# Patient Record
Sex: Female | Born: 1940 | Race: White | Hispanic: No | Marital: Single | Smoking: Former smoker
Health system: Southern US, Community
[De-identification: ages and names within clinical notes are randomized; demographics above are authoritative.]

## PROBLEM LIST (undated history)

## (undated) DIAGNOSIS — R945 Abnormal results of liver function studies: Secondary | ICD-10-CM

## (undated) DIAGNOSIS — M797 Fibromyalgia: Secondary | ICD-10-CM

## (undated) DIAGNOSIS — M8580 Other specified disorders of bone density and structure, unspecified site: Secondary | ICD-10-CM

## (undated) DIAGNOSIS — F32A Depression, unspecified: Secondary | ICD-10-CM

## (undated) DIAGNOSIS — F329 Major depressive disorder, single episode, unspecified: Secondary | ICD-10-CM

## (undated) DIAGNOSIS — Z820 Family history of epilepsy and other diseases of the nervous system: Secondary | ICD-10-CM

## (undated) DIAGNOSIS — M1990 Unspecified osteoarthritis, unspecified site: Secondary | ICD-10-CM

## (undated) DIAGNOSIS — E039 Hypothyroidism, unspecified: Secondary | ICD-10-CM

## (undated) DIAGNOSIS — K900 Celiac disease: Secondary | ICD-10-CM

## (undated) DIAGNOSIS — R7989 Other specified abnormal findings of blood chemistry: Secondary | ICD-10-CM

## (undated) HISTORY — PX: NASAL SINUS SURGERY: SHX719

## (undated) HISTORY — DX: Celiac disease: K90.0

## (undated) HISTORY — PX: ABDOMINAL HYSTERECTOMY: SHX81

## (undated) HISTORY — DX: Depression, unspecified: F32.A

## (undated) HISTORY — DX: Other specified disorders of bone density and structure, unspecified site: M85.80

## (undated) HISTORY — DX: Unspecified osteoarthritis, unspecified site: M19.90

## (undated) HISTORY — DX: Fibromyalgia: M79.7

## (undated) HISTORY — DX: Major depressive disorder, single episode, unspecified: F32.9

## (undated) HISTORY — PX: COSMETIC SURGERY: SHX468

## (undated) HISTORY — PX: OTHER SURGICAL HISTORY: SHX169

## (undated) HISTORY — PX: KNEE ARTHROSCOPY: SUR90

## (undated) HISTORY — DX: Abnormal results of liver function studies: R94.5

## (undated) HISTORY — DX: Hypothyroidism, unspecified: E03.9

## (undated) HISTORY — PX: BREAST SURGERY: SHX581

## (undated) HISTORY — PX: EXCISION MORTON'S NEUROMA: SHX5013

## (undated) HISTORY — DX: Other specified abnormal findings of blood chemistry: R79.89

## (undated) HISTORY — DX: Family history of epilepsy and other diseases of the nervous system: Z82.0

## (undated) HISTORY — PX: BLADDER SURGERY: SHX569

## (undated) HISTORY — PX: AUGMENTATION MAMMAPLASTY: SUR837

---

## 2000-04-21 ENCOUNTER — Other Ambulatory Visit: Admission: RE | Admit: 2000-04-21 | Discharge: 2000-04-21 | Payer: Self-pay | Admitting: Internal Medicine

## 2000-04-21 LAB — CONVERTED CEMR LAB

## 2000-09-06 ENCOUNTER — Encounter: Admission: RE | Admit: 2000-09-06 | Discharge: 2000-09-06 | Payer: Self-pay | Admitting: Internal Medicine

## 2000-09-06 ENCOUNTER — Encounter: Payer: Self-pay | Admitting: Internal Medicine

## 2001-05-13 ENCOUNTER — Ambulatory Visit (HOSPITAL_BASED_OUTPATIENT_CLINIC_OR_DEPARTMENT_OTHER): Admission: RE | Admit: 2001-05-13 | Discharge: 2001-05-13 | Payer: Self-pay | Admitting: Internal Medicine

## 2001-10-09 ENCOUNTER — Encounter: Payer: Self-pay | Admitting: Emergency Medicine

## 2001-10-09 ENCOUNTER — Inpatient Hospital Stay (HOSPITAL_COMMUNITY): Admission: EM | Admit: 2001-10-09 | Discharge: 2001-10-10 | Payer: Self-pay | Admitting: Emergency Medicine

## 2002-07-12 ENCOUNTER — Encounter: Payer: Self-pay | Admitting: Internal Medicine

## 2002-07-12 ENCOUNTER — Encounter: Admission: RE | Admit: 2002-07-12 | Discharge: 2002-07-12 | Payer: Self-pay | Admitting: Internal Medicine

## 2002-08-29 ENCOUNTER — Encounter: Admission: RE | Admit: 2002-08-29 | Discharge: 2002-11-27 | Payer: Self-pay | Admitting: Internal Medicine

## 2004-11-16 ENCOUNTER — Ambulatory Visit: Payer: Self-pay | Admitting: Internal Medicine

## 2004-11-30 ENCOUNTER — Encounter: Admission: RE | Admit: 2004-11-30 | Discharge: 2004-11-30 | Payer: Self-pay | Admitting: Internal Medicine

## 2004-12-09 ENCOUNTER — Encounter: Admission: RE | Admit: 2004-12-09 | Discharge: 2004-12-09 | Payer: Self-pay | Admitting: Internal Medicine

## 2004-12-17 ENCOUNTER — Ambulatory Visit: Payer: Self-pay | Admitting: Internal Medicine

## 2005-06-20 ENCOUNTER — Ambulatory Visit: Payer: Self-pay | Admitting: Internal Medicine

## 2005-12-26 ENCOUNTER — Ambulatory Visit: Payer: Self-pay | Admitting: Internal Medicine

## 2005-12-28 ENCOUNTER — Encounter: Admission: RE | Admit: 2005-12-28 | Discharge: 2005-12-28 | Payer: Self-pay | Admitting: Internal Medicine

## 2006-01-17 ENCOUNTER — Ambulatory Visit: Payer: Self-pay | Admitting: Gastroenterology

## 2006-01-23 ENCOUNTER — Ambulatory Visit: Payer: Self-pay | Admitting: Internal Medicine

## 2006-01-27 ENCOUNTER — Ambulatory Visit: Payer: Self-pay | Admitting: Internal Medicine

## 2006-01-30 ENCOUNTER — Encounter: Admission: RE | Admit: 2006-01-30 | Discharge: 2006-01-30 | Payer: Self-pay | Admitting: Internal Medicine

## 2006-01-30 ENCOUNTER — Ambulatory Visit: Payer: Self-pay | Admitting: Gastroenterology

## 2006-02-01 ENCOUNTER — Ambulatory Visit: Payer: Self-pay | Admitting: Gastroenterology

## 2006-02-01 ENCOUNTER — Encounter (INDEPENDENT_AMBULATORY_CARE_PROVIDER_SITE_OTHER): Payer: Self-pay | Admitting: *Deleted

## 2006-02-01 ENCOUNTER — Encounter: Payer: Self-pay | Admitting: Internal Medicine

## 2006-02-01 LAB — HM COLONOSCOPY

## 2006-02-07 ENCOUNTER — Ambulatory Visit: Payer: Self-pay | Admitting: Internal Medicine

## 2006-02-28 ENCOUNTER — Ambulatory Visit: Payer: Self-pay | Admitting: Gastroenterology

## 2006-11-09 ENCOUNTER — Ambulatory Visit: Payer: Self-pay | Admitting: Internal Medicine

## 2007-03-06 ENCOUNTER — Encounter: Admission: RE | Admit: 2007-03-06 | Discharge: 2007-03-06 | Payer: Self-pay | Admitting: Internal Medicine

## 2007-03-06 LAB — HM MAMMOGRAPHY: HM Mammogram: NORMAL

## 2007-06-26 ENCOUNTER — Telehealth: Payer: Self-pay | Admitting: Internal Medicine

## 2007-09-12 ENCOUNTER — Ambulatory Visit: Payer: Self-pay | Admitting: Internal Medicine

## 2007-09-12 DIAGNOSIS — G43009 Migraine without aura, not intractable, without status migrainosus: Secondary | ICD-10-CM | POA: Insufficient documentation

## 2007-09-12 DIAGNOSIS — F3289 Other specified depressive episodes: Secondary | ICD-10-CM | POA: Insufficient documentation

## 2007-09-12 DIAGNOSIS — M899 Disorder of bone, unspecified: Secondary | ICD-10-CM | POA: Insufficient documentation

## 2007-09-12 DIAGNOSIS — F329 Major depressive disorder, single episode, unspecified: Secondary | ICD-10-CM

## 2007-09-12 DIAGNOSIS — M949 Disorder of cartilage, unspecified: Secondary | ICD-10-CM

## 2007-09-12 DIAGNOSIS — R945 Abnormal results of liver function studies: Secondary | ICD-10-CM | POA: Insufficient documentation

## 2007-09-12 DIAGNOSIS — K900 Celiac disease: Secondary | ICD-10-CM | POA: Insufficient documentation

## 2007-09-12 DIAGNOSIS — M1990 Unspecified osteoarthritis, unspecified site: Secondary | ICD-10-CM | POA: Insufficient documentation

## 2007-09-12 DIAGNOSIS — E039 Hypothyroidism, unspecified: Secondary | ICD-10-CM | POA: Insufficient documentation

## 2007-09-12 LAB — CONVERTED CEMR LAB
Bilirubin Urine: NEGATIVE
Glucose, Urine, Semiquant: NEGATIVE
Ketones, urine, test strip: NEGATIVE
Nitrite: NEGATIVE
Protein, U semiquant: NEGATIVE
Specific Gravity, Urine: <1.005
WBC Urine, dipstick: NEGATIVE
pH: 5.5

## 2007-09-14 ENCOUNTER — Telehealth: Payer: Self-pay | Admitting: *Deleted

## 2007-09-14 LAB — CONVERTED CEMR LAB
ALT: 36 units/L — ABNORMAL HIGH (ref 0–35)
AST: 29 units/L (ref 0–37)
Albumin: 4.3 g/dL (ref 3.5–5.2)
Alkaline Phosphatase: 64 units/L (ref 39–117)
BUN: 12 mg/dL (ref 6–23)
Basophils Relative: 0.2 % (ref 0.0–1.0)
Bilirubin, Direct: 0.1 mg/dL (ref 0.0–0.3)
CO2: 26 meq/L (ref 19–32)
Calcium: 10 mg/dL (ref 8.4–10.5)
Chloride: 99 meq/L (ref 96–112)
Cholesterol: 257 mg/dL (ref 0–200)
Creatinine, Ser: 0.8 mg/dL (ref 0.4–1.2)
Direct LDL: 133.2 mg/dL
Eosinophils Relative: 2.3 % (ref 0.0–5.0)
GFR calc Af Amer: 93 mL/min
GFR calc non Af Amer: 77 mL/min
Glucose, Bld: 85 mg/dL (ref 70–99)
HCT: 41.3 % (ref 36.0–46.0)
HDL: 89.9 mg/dL (ref >39.0)
Hemoglobin: 14.2 g/dL (ref 12.0–15.0)
Lymphocytes Relative: 24.2 % (ref 12.0–46.0)
MCHC: 34.4 g/dL (ref 30.0–36.0)
MCV: 91.7 fL (ref 78.0–100.0)
Monocytes Relative: 5.2 % (ref 3.0–11.0)
Neutrophils Relative %: 68.1 % (ref 43.0–77.0)
Platelets: 205 10*3/uL (ref 150–400)
Potassium: 4.4 meq/L (ref 3.5–5.1)
RBC: 4.5 M/uL (ref 3.87–5.11)
RDW: 12.8 % (ref 11.5–14.6)
Sodium: 139 meq/L (ref 135–145)
TSH: 0.99 microintl units/mL (ref 0.35–5.50)
Total Bilirubin: 0.7 mg/dL (ref 0.3–1.2)
Total CHOL/HDL Ratio: 2.9
Total Protein: 7.5 g/dL (ref 6.0–8.3)
Triglycerides: 88 mg/dL (ref 0–149)
VLDL: 18 mg/dL (ref 0–40)
WBC: 6.1 10*3/uL (ref 4.5–10.5)

## 2008-08-19 ENCOUNTER — Ambulatory Visit: Payer: Self-pay | Admitting: Internal Medicine

## 2008-08-19 ENCOUNTER — Encounter: Payer: Self-pay | Admitting: Internal Medicine

## 2009-01-12 ENCOUNTER — Telehealth: Payer: Self-pay | Admitting: Internal Medicine

## 2009-02-18 ENCOUNTER — Ambulatory Visit: Payer: Self-pay | Admitting: Internal Medicine

## 2009-02-19 LAB — CONVERTED CEMR LAB
Albumin: 4 g/dL (ref 3.5–5.2)
BUN: 8 mg/dL (ref 6–23)
Bilirubin, Direct: 0 mg/dL (ref 0.0–0.3)
CO2: 32 meq/L (ref 19–32)
Calcium: 9.3 mg/dL (ref 8.4–10.5)
Chloride: 109 meq/L (ref 96–112)
Eosinophils Absolute: 0.1 10*3/uL (ref 0.0–0.7)
Glucose, Bld: 93 mg/dL (ref 70–99)
Lymphocytes Relative: 21.5 % (ref 12.0–46.0)
Lymphs Abs: 1.1 10*3/uL (ref 0.7–4.0)
MCHC: 33.7 g/dL (ref 30.0–36.0)
MCV: 92.1 fL (ref 78.0–100.0)
Monocytes Absolute: 0.3 10*3/uL (ref 0.1–1.0)
Monocytes Relative: 5.8 % (ref 3.0–12.0)
Neutro Abs: 3.4 10*3/uL (ref 1.4–7.7)
Platelets: 183 10*3/uL (ref 150.0–400.0)
Potassium: 4.2 meq/L (ref 3.5–5.1)
RBC: 4.51 M/uL (ref 3.87–5.11)
RDW: 12.9 % (ref 11.5–14.6)
Total Protein: 7.1 g/dL (ref 6.0–8.3)
Triglycerides: 52 mg/dL (ref 0.0–149.0)
WBC: 4.9 10*3/uL (ref 4.5–10.5)

## 2009-04-17 ENCOUNTER — Encounter: Admission: RE | Admit: 2009-04-17 | Discharge: 2009-04-17 | Payer: Self-pay | Admitting: Orthopedic Surgery

## 2009-06-26 ENCOUNTER — Encounter: Admission: RE | Admit: 2009-06-26 | Discharge: 2009-06-26 | Payer: Self-pay | Admitting: Otolaryngology

## 2009-08-04 ENCOUNTER — Encounter: Payer: Self-pay | Admitting: Internal Medicine

## 2009-08-15 ENCOUNTER — Encounter: Payer: Self-pay | Admitting: Internal Medicine

## 2009-08-17 ENCOUNTER — Encounter: Admission: RE | Admit: 2009-08-17 | Discharge: 2009-08-17 | Payer: Self-pay

## 2009-08-18 ENCOUNTER — Encounter: Payer: Self-pay | Admitting: Internal Medicine

## 2009-09-22 ENCOUNTER — Encounter (INDEPENDENT_AMBULATORY_CARE_PROVIDER_SITE_OTHER): Payer: Self-pay | Admitting: *Deleted

## 2009-09-24 ENCOUNTER — Encounter: Payer: Self-pay | Admitting: Internal Medicine

## 2010-05-24 ENCOUNTER — Ambulatory Visit: Payer: Self-pay | Admitting: Internal Medicine

## 2010-05-24 DIAGNOSIS — M549 Dorsalgia, unspecified: Secondary | ICD-10-CM | POA: Insufficient documentation

## 2010-05-24 LAB — CONVERTED CEMR LAB
ALT: 17 units/L (ref 0–35)
Albumin: 4.3 g/dL (ref 3.5–5.2)
BUN: 13 mg/dL (ref 6–23)
Bilirubin, Direct: 0.1 mg/dL (ref 0.0–0.3)
Cholesterol: 172 mg/dL (ref 0–200)
Creatinine, Ser: 0.6 mg/dL (ref 0.4–1.2)
GFR calc non Af Amer: 99.83 mL/min (ref >60)
HCT: 40.9 % (ref 36.0–46.0)
HDL: 66.1 mg/dL (ref >39.00)
Hemoglobin: 14 g/dL (ref 12.0–15.0)
LDL Cholesterol: 89 mg/dL (ref 0–99)
Lymphocytes Relative: 29.3 % (ref 12.0–46.0)
Lymphs Abs: 1.7 10*3/uL (ref 0.7–4.0)
Monocytes Relative: 9.4 % (ref 3.0–12.0)
Neutrophils Relative %: 57 % (ref 43.0–77.0)
Platelets: 233 10*3/uL (ref 150.0–400.0)
RBC: 4.43 M/uL (ref 3.87–5.11)
RDW: 13.5 % (ref 11.5–14.6)
Sodium: 140 meq/L (ref 135–145)
Total CHOL/HDL Ratio: 3
Triglycerides: 83 mg/dL (ref 0.0–149.0)
VLDL: 16.6 mg/dL (ref 0.0–40.0)

## 2010-05-31 ENCOUNTER — Encounter: Payer: Self-pay | Admitting: Internal Medicine

## 2010-05-31 ENCOUNTER — Telehealth: Payer: Self-pay | Admitting: Internal Medicine

## 2010-09-06 ENCOUNTER — Encounter: Payer: Self-pay | Admitting: Internal Medicine

## 2010-10-31 ENCOUNTER — Encounter: Payer: Self-pay | Admitting: Internal Medicine

## 2010-11-09 NOTE — Progress Notes (Signed)
Summary: request med change  Phone Note Call from Patient Call back at Home Phone [phone]   Caller: Patient live Call For: swords Summary of Call: Does not want to pay for lidoderm patches.  Says has tried friend's tramadol 1/2 before golf with good results except drowsiness.  Would like trial called to [facility]. Initial call taken by: Gladis Riffle, RN,  May 31, 2010 3:31 PM  Follow-up for Phone Call        d/c lidoderm tramadol 50 mg 1/2 by mouth once daily as needed  #20/1 Follow-up by: Birdie Sons MD,  May 31, 2010 3:45 PM  Additional Follow-up for Phone Call Additional follow up Details #1::        See med list and Rx.Patient notified.  Additional Follow-up by: Gladis Riffle, RN,  June 01, 2010 9:22 AM    New/Updated Medications: TRAMADOL HCL 50 MG TABS (TRAMADOL HCL) Take 1/2 by mouth as needed Prescriptions: TRAMADOL HCL 50 MG TABS (TRAMADOL HCL) Take 1/2 by mouth as needed  #20 x 1   Entered by:   Gladis Riffle, RN   Authorized by:   Birdie Sons MD   Signed by:   Gladis Riffle, RN on 06/01/2010   Method used:   Electronically to        [facility] (retail)       [address]       [place], [place]  [id number]       Ph: [phone] or [phone]       Fax: [phone]   RxID:   [id number]

## 2010-11-09 NOTE — Miscellaneous (Signed)
Summary: Immunization Entry   Immunization History:  Influenza Immunization History:    Influenza:  historical (09/01/2010)

## 2010-11-09 NOTE — Medication Information (Signed)
Summary: Prior Authorization Request for Lidoderm  Prior Authorization Request for Lidoderm   Imported By: Maryln Gottron 06/03/2010 11:13:12  _____________________________________________________________________  External Attachment:    Type:   Image     Comment:   External Document

## 2010-11-09 NOTE — Assessment & Plan Note (Signed)
Summary: pt will come in fasting/njr   Vital Signs:  Patient profile:   70 year old female Menstrual status:  hysterectomy Height:      62 inches Weight:      116.5 pounds BMI:     21.39 Temp:     98.1 degrees F oral Pulse rate:   72 / minute Pulse rhythm:   regular Resp:     14 per minute BP sitting:   118 / 70  (left arm) Cuff size:   regular  Vitals Entered By: Gladis Riffle, RN (May 24, 2010 10:11 AM) CC: annual review of systems, fasting--c/o arthritis and fibromyalgia pain so stays in bed about one month of the year Is Patient Diabetic? No     Menstrual Status hysterectomy Last PAP Result done   CC:  annual review of systems and fasting--c/o arthritis and fibromyalgia pain so stays in bed about one month of the year.  History of Present Illness: Here for Medicare AWV:  1.   Risk factors based on Past M, S, F history:---see list 2.   Physical Activities: --she is able to do all ADLs, she remains physicaly active 3.   Depression/mood: --pt denies 4.   Hearing: --no complaints 5.   ADL's: --she requires no help 6.   Fall Risk: none known 7.   Home Safety: -no concerns 8.   Height, weight, &visual acuity:see exam, yearly eye exam 9.   Counseling: --advised regular exercise and to be vigilant about heatlh maintenance activities 10.   Labs ordered based on risk factors: - see list 11.           Referral Coordination-none necessary, she has regular GI f/u 12.           Care Plan--advised regular exercise,  13.            Cognitive Assessment --no concerns, she has normal motor and sensory and cognitive function   probs: LIVER FUNCTION TESTS, ABNORMAL (ICD-794.8)---needs f/u COMMON MIGRAINE (ICD-346.10)---no change in sxs FIBROMYALGIA (ICD-729.1)---she complains of increasing sxs. says exacerbation started with mother's illness and death. has noted more pain when she is trying to be active---worse with DCN "off the market" CELIAC DISEASE (ICD-579.0)--followed by  GI OSTEOARTHRITIS (ICD-715.90)---she doesn't know if increasing pain related to fibromyalgia or OA HYPOTHYROIDISM (ICD-244.9)---needs f/u  All other systems reviewed and were negative    All other systems reviewed and were negative   Preventive Screening-Counseling & Management  Alcohol-Tobacco     Smoking Status: quit  Current Problems (verified): 1)  Preventive Health Care  (ICD-V70.0) 2)  Osteopenia  (ICD-733.90) 3)  Depression  (ICD-311) 4)  Liver Function Tests, Abnormal  (ICD-794.8) 5)  Common Migraine  (ICD-346.10) 6)  Fibromyalgia  (ICD-729.1) 7)  Celiac Disease  (ICD-579.0) 8)  Osteoarthritis  (ICD-715.90) 9)  Hypothyroidism  (ICD-244.9)  Current Medications (verified): 1)  Armour Thyroid 60 Mg  Tabs (Thyroid) .... Take 1 Tablet By Mouth Once A Day of Compounded 75 Mg 2)  Astelin 137 Mcg/spray Soln (Azelastine Hcl) .... Inhale 1 Puff Into Both Nostrils  Twice A Day As Needed 3)  Imitrex 100 Mg Tabs (Sumatriptan Succinate) .... Take 1/2-1 Tablet By Mouth Once A Day As Needed 4)  Estraderm 0.1 Mg/24hr  Pttw (Estradiol) .... Change Every 4 Days 5)  Fluticasone Propionate 50 Mcg/act  Susp (Fluticasone Propionate) .... 2 Sprays Each Nostril Once Daily 6)  Calcium 1500 Mg Tabs (Calcium Carbonate) .... Once Daily 7)  Vitamin D 1000 Unit Caps (  Cholecalciferol) .... Once Daily  Allergies: 1)  ! Penicillin V Potassium (Penicillin V Potassium) 2)  ! Sulfamethoxazole (Sulfamethoxazole)  Past History:  Past Medical History: Last updated: 10/02/2007 Hypothyroidism Osteoarthritis celiac disease fibromyalgia migraine headache elevated LFT's--check Hep B & C Depression Osteopenia  Past Surgical History: Last updated: Oct 02, 2007 Hysterectomy, bilateral S & O Sinus surgery bone spur left knee arthroscopy right knee bone spurs hand right hand Fx bladder surgery breast implants X 2 plastic surgery face Mortons neuroma  Family History: Last updated:  02-Oct-2007 father deceased 52 yo mother alive 27  Social History: Last updated: 10/02/2007 Single Former Smoker Regular exercise-no  Risk Factors: Exercise: no (2007-10-02)  Risk Factors: Smoking Status: quit (05/24/2010)  Physical Exam  General:  alert and well-developed.   Head:  normocephalic and atraumatic.   Eyes:  pupils equal and pupils round.   Ears:  R ear normal and L ear normal.   Neck:  No deformities, masses, or tenderness noted. Lungs:  normal respiratory effort and no intercostal retractions.   Heart:  normal rate and regular rhythm.   Abdomen:  soft and non-tender.   Msk:  No deformity or scoliosis noted of thoracic or lumbar spine.   Pulses:  R radial normal and L radial normal.   Neurologic:  cranial nerves II-XII intact and gait normal.   Skin:  turgor normal and color normal.   Cervical Nodes:  no anterior cervical adenopathy and no posterior cervical adenopathy.   Psych:  normally interactive and good eye contact.     Impression & Recommendations:  Problem # 1:  PREVENTIVE HEALTH CARE (ICD-V70.0)  health maint UTD see orders and pt instructions conintue regular exercise.   Orders: Medicare -1st Annual Wellness Visit [phone]) TLB-Lipid Panel (80061-LIPID) Radiology Referral (Radiology)  Problem # 2:  HYPOTHYROIDISM (ICD-244.9) needs f/u   Her updated medication list for this problem includes:    Armour Thyroid 60 Mg Tabs (Thyroid) .[name]... Take 1 tablet by mouth once a day of compounded 75 mg  Labs Reviewed: TSH: 0.96 (02/18/2009)    Chol: 193 (02/18/2009)   HDL: 71.70 (02/18/2009)   LDL: 111 (02/18/2009)   TG: 52.0 (02/18/2009)  Problem # 3:  LIVER FUNCTION TESTS, ABNORMAL (ICD-794.8) needs f/u  Problem # 4:  FIBROMYALGIA (ICD-729.1)  chronic problem which interferes with the acitivities she enjoys  Problem # 5:  BACK PAIN (ICD-724.5)  chronic back pain she has used mom's lidoderm patch with relief  Complete Medication List: 1)   Armour Thyroid 60 Mg Tabs (Thyroid) .... Take 1 tablet by mouth once a day of compounded 75 mg 2)  Astelin 137 Mcg/spray Soln (Azelastine hcl) .... Inhale 1 puff into both nostrils  twice a day as needed 3)  Imitrex 100 Mg Tabs (Sumatriptan succinate) .... Take 1/2-1 tablet by mouth once a day as needed 4)  Estraderm 0.1 Mg/24hr Pttw (Estradiol) .... Change every 4 days 5)  Fluticasone Propionate 50 Mcg/act Susp (Fluticasone propionate) .... 2 sprays each nostril once daily 6)  Calcium 1500 Mg Tabs (Calcium carbonate) .... Once daily 7)  Vitamin D 1000 Unit Caps (Cholecalciferol) .... Once daily 8)  Lidoderm 5 % Ptch (Lidocaine) .... Use 12 out of 24 hours daily  Other Orders: TD Toxoids IM 7 YR + (60454) Admin 1st Vaccine (09811) TLB-TSH (Thyroid Stimulating Hormone) (84443-TSH) TLB-CBC Platelet - w/Differential (85025-CBCD) TLB-Hepatic/Liver Function Pnl (80076-HEPATIC) TLB-BMP (Basic Metabolic Panel-BMET) (80048-METABOL)  Patient Instructions: 1)  see me 6 weeks 2)  schedule DEXA Prescriptions: LIDODERM  5 % PTCH (LIDOCAINE) use 12 out of 24 hours daily  #30 x 3   Entered and Authorized by:   Birdie Sons MD   Signed by:   Birdie Sons MD on 05/24/2010   Method used:   Electronically to        [facility] (retail)       [address]       [place], [place]  [address]       Ph: [phone] or [phone]       Fax: [phone]   RxID:   [id number]    Immunizations Administered:  Tetanus Vaccine:    Vaccine Type: Td    Site: left deltoid    Mfr: Sanofi Pasteur    Dose: 0.5 ml    Route: IM    Given by: Gladis Riffle, RN    Exp. Date: 11/11/2011    Lot #: [id number]   Appended Document: Orders Update     Clinical Lists Changes  Orders: Added new Service order of Venipuncture (64332) - Signed Added new Service order of Specimen Handling (95188) - Signed

## 2010-12-09 ENCOUNTER — Encounter: Payer: Self-pay | Admitting: Internal Medicine

## 2010-12-10 ENCOUNTER — Ambulatory Visit (INDEPENDENT_AMBULATORY_CARE_PROVIDER_SITE_OTHER): Payer: Medicare Other | Admitting: Internal Medicine

## 2010-12-10 ENCOUNTER — Encounter: Payer: Self-pay | Admitting: Internal Medicine

## 2010-12-10 VITALS — BP 132/88 | HR 84 | Temp 98.9°F | Wt 118.0 lb

## 2010-12-10 DIAGNOSIS — M1990 Unspecified osteoarthritis, unspecified site: Secondary | ICD-10-CM

## 2010-12-10 DIAGNOSIS — K900 Celiac disease: Secondary | ICD-10-CM

## 2010-12-10 MED ORDER — PREDNISONE 5 MG PO TABS: 5.0000 mg | ORAL_TABLET | Freq: Every day | ORAL | Status: DC

## 2010-12-10 NOTE — Assessment & Plan Note (Signed)
Chronic problem. She has regular followup with gastroenterology.

## 2010-12-10 NOTE — Progress Notes (Signed)
  Subjective:    Patient ID: Wanda Downs, female    DOB: [birth year], 70 y.o.   MRN: [id number]  HPI  Chronic pain syndrome---on tramadol, not really effective. She requests referral to pain management  Hypothyroid  Sprue--doing well on diet  Past Medical History  Diagnosis Date  . Hypothyroidism   . Osteoarthritis   . Celiac disease   . Fibromyalgia   . FHx: migraine headaches   . Elevated LFTs     check Hep B & C  . Depression   . Osteopenia    Past Surgical History  Procedure Date  . Abdominal hysterectomy     Bilateral S & O  . Nasal sinus surgery   . Bone spur     left knee  . Knee arthroscopy     right knee  . Bone spur     hand  . Right hand fx   . Bladder surgery   . Breast surgery     Breast implants X 2  . Cosmetic surgery     Plastic surgery (face)  . Excision morton's neuroma     reports that she has quit smoking. She does not have any smokeless tobacco history on file. Her alcohol and drug histories not on file. family history includes Arthritis in her mother and Hypertension in her mother.    Allergies  Allergen Reactions  . Penicillins     REACTION: rash, swelling  . Sulfamethoxazole     REACTION: rash, swelling     Review of Systems  patient denies chest pain, shortness of breath, orthopnea. Denies lower extremity edema, abdominal pain, change in appetite, change in bowel movements. Patient denies rashes, musculoskeletal complaints. No other specific complaints in a complete review of systems.      Objective:   Physical Exam  Well-developed well-nourished female in no acute distress. HEENT exam atraumatic, normocephalic, extraocular muscles are intact. Neck is supple. No jugular venous distention no thyromegaly. Chest clear to auscultation without increased work of breathing. Cardiac exam S1 and S2 are regular. Abdominal exam active bowel sounds, soft, nontender. Extremities no edema. Neurologic exam she is alert without any motor  sensory deficits. Gait is normal.        Assessment & Plan:

## 2010-12-10 NOTE — Assessment & Plan Note (Signed)
Discussed at length She can't take nsaids She can't take narcotics without significant lethargy Trail low dose steroids---side effects discussed

## 2010-12-28 ENCOUNTER — Ambulatory Visit: Payer: BC Managed Care – PPO | Admitting: Internal Medicine

## 2011-01-10 ENCOUNTER — Encounter: Payer: Self-pay | Admitting: Internal Medicine

## 2011-01-10 ENCOUNTER — Ambulatory Visit (INDEPENDENT_AMBULATORY_CARE_PROVIDER_SITE_OTHER): Payer: Medicare Other | Admitting: Internal Medicine

## 2011-01-10 VITALS — BP 114/70 | HR 68 | Temp 98.6°F | Ht 61.0 in | Wt 119.0 lb

## 2011-01-10 DIAGNOSIS — IMO0001 Reserved for inherently not codable concepts without codable children: Secondary | ICD-10-CM

## 2011-01-10 DIAGNOSIS — Z0000 Encounter for general adult medical examination without abnormal findings: Secondary | ICD-10-CM

## 2011-01-10 MED ORDER — HYDROCODONE-ACETAMINOPHEN 5-500 MG PO TABS: 1.0000 | ORAL_TABLET | Freq: Every day | ORAL | Status: AC | PRN

## 2011-01-10 NOTE — Progress Notes (Signed)
  Subjective:    Patient ID: Wanda Downs, female    DOB: July 23, 1941, 70 y.o.   MRN: [id number]  HPI  Reviewed previous note Pt has chronic pain syndrome---we tried prednisone at a low dose. She had some intial relief and was able to play golf but pain has recurred "with a vengeance". "i hurt all over". Sometimes she is unable to hold onto a golf club. Prednisone without any side effects but is not helping with pain. She has had some relief with low dose vicodin (uses 1/4 of a 5/500)--causes some fatigue. She does admit to sleeping more than usual.  Past Medical History  Diagnosis Date  . Hypothyroidism   . Osteoarthritis   . Celiac disease   . Fibromyalgia   . FHx: migraine headaches   . Elevated LFTs     check Hep B & C  . Depression   . Osteopenia    Past Surgical History  Procedure Date  . Abdominal hysterectomy     Bilateral S & O  . Nasal sinus surgery   . Bone spur     left knee  . Knee arthroscopy     right knee  . Bone spur     hand  . Right hand fx   . Bladder surgery   . Breast surgery     Breast implants X 2  . Cosmetic surgery     Plastic surgery (face)  . Excision morton's neuroma     reports that she has quit smoking. She does not have any smokeless tobacco history on file. Her alcohol and drug histories not on file. family history includes Arthritis in her mother and Hypertension in her mother. Allergies  Allergen Reactions  . Penicillins     REACTION: rash, swelling  . Sulfamethoxazole     REACTION: rash, swelling     Review of Systems  patient denies chest pain, shortness of breath, orthopnea. Denies lower extremity edema, abdominal pain, change in appetite, change in bowel movements. Patient denies rashes, musculoskeletal complaints. No other specific complaints in a complete review of systems.      Objective:   Physical Exam  Well-developed well-nourished female in no acute distress. HEENT exam atraumatic, normocephalic, extraocular  muscles are intact. Neck is supple. No jugular venous distention no thyromegaly. Chest clear to auscultation without increased work of breathing. Cardiac exam S1 and S2 are regular. Abdominal exam active bowel sounds, soft, nontender. Extremities no edema. Neurologic exam she is alert without any motor sensory deficits. Gait is normal.      Assessment & Plan:

## 2011-01-10 NOTE — Assessment & Plan Note (Addendum)
Reviewed at length Discussed multiple options She refuses all: neurontin, lyrica, cymbalta, elavil.  The plan for now will be low dose vicodin longterm side effects discussed Total time > 20 minutes all FTF, greater than 1/2 spent counselling

## 2011-02-25 NOTE — H&P (Signed)
[facility]  Patient:    Wanda Downs, Wanda Downs Visit Number: [id number] MRN: [id number]          Service Type: MED Location: [phone] Attending Physician:  Tresa Garter Dictated by:   Sonda Primes, M.D. [facility] Admit Date:  10/09/2001   CC:         [name], M.D. [facility] [place] [facility]   History and Physical  DATE OF BIRTH:  07/12/1941  CHIEF COMPLAINT:  Abdominal pain, nausea, vomiting and weakness.  HISTORY OF PRESENT ILLNESS:  The patient is a 70 year old female who presented to the ER with the above complaints starting at 11 p.m. last night.  She had multiple loose watery stools and frequent vomiting.  Lately, she noticed blood mixed with the stool.  She has been getting progressively weak.  In fact, she tells me that she has been sick with a sore throat and cold-like symptoms for several days, was seen at the urgent care and given Duricef, of which she took four pills.  She also had a migraine headache this morning and took half a Darvocet and Imitrex.  PAST MEDICAL HISTORY:  Celiac disease diagnosed two to three years ago in [place], migraine headaches, hypothyroidism, hormone replacement therapy.  ALLERGIES:  PENICILLIN and SULFA.  CURRENT MEDICATIONS: 1. Estrogen patch. 2. Thyroid 90 mcg one-half once a day.  SOCIAL HISTORY:  She lives alone, does not smoke or drink alcohol.  She moved here from [place] two to three years ago.  She enjoys playing golf.  FAMILY HISTORY:  Negative for diabetes or heart attacks.  No inflammatory bowel disease.  REVIEW OF SYSTEMS:  She states she had upper and lower endoscopy about two to three years ago.  She has been on a gluten-free diet.  Occasional migraines. Occasional joint pains.  No chest pain.  No syncope.  The rest is negative or as above.    PHYSICAL EXAMINATION:  VITAL SIGNS:  Blood pressure 125/62, heart rate 79, respirations 20, temperature 99.3.  GENERAL:  She is in  no acute distress, looks tired.  HEENT:  Dry mucosa.  NECK:  Supple.  No thyromegaly or bruit.  LUNGS:  Clear to auscultation and percussion.  No wheezes or rales.  HEART:  S1 and S2.  No enlargement to percussion.  ABDOMEN:  Soft and tender diffusely, mostly in the suprapubic area.  EXTREMITIES:  Lower extremities without edema.  SKIN:  Skin with aging changes.  No jaundice.  NEUROLOGIC:  Cranial nerves II-XII normal.  Deep tendon muscle strength normal.  She is alert, oriented and cooperative.  There are no meningeal signs.  LABORATORY DATA:  Sodium 132, potassium 3.7.  LFTs normal.  White count 12.8, hemoglobin 13.9.  Abdominal x-ray pending.  ASSESSMENT AND PLAN: 1. Nausea, vomiting and dehydration, possible gastroenteritis.  Will treat    with intravenous fluids, Lomotil p.r.n. and Phenergan p.r.n. 2. Dehydration.  Will treat with intravenous fluids. 3. Hematochezia.  Will check complete blood count tomorrow.  Possibly related    to internal hemorrhoids.  Rectal examination was done by Dr. Adriana Simas.  Will    need gastroenterology consult or an outpatient colonoscopy, depending on    how she does. 4. Celiac disease.  She will be maintained on a gluten-free diet. 5. Migraine headaches.  Will use Imitrex p.r.n. Dictated by:   Sonda Primes, M.D. [facility] Attending Physician:  Tresa Garter DD:  10/09/01 TD:  10/10/01 Job: [id number]

## 2011-02-25 NOTE — Discharge Summary (Signed)
[place]  Patient:    KRISANNE, LICH Visit Number: [id number] MRN: [id number]          Service Type: MED Location: [phone] Attending Physician:  Tresa Garter Dictated by:   Sonda Primes, M.D. [facility] Admit Date:  10/09/2001 Disc. Date: 10/10/01   CC:         Bruce H. Swords, M.D. [facility]   Discharge Summary  DATE OF BIRTH:  04-08-1941.  FINAL DIAGNOSES: 1. Acute gastroenteritis. 2. Dehydration due to problem #1. 3. Abdominal pain due to problem #1, resolved. 4. Celiac disease. 5. Hematochezia due to #1, resolved.  DISCHARGE MEDICATIONS: 1. Lomotil two capsules four times a day as needed. 2. Phenergan 25 mg one four times a day as needed. 3. Zantac 150 mg one once a day as needed. 4. Resume other home medicines except for the antibiotic.  ACTIVITY:  As tolerated.  DIET:  Gluten-free.  SPECIAL INSTRUCTIONS:  Call if problems.  FOLLOW-UP:  Dr. Cato Mulligan in seven to 10 days.  She will need a GI consultation in the near future to follow up on her hematochezia.  HISTORY OF PRESENT ILLNESS:  The patient is a 70 year old female who was admitted with a clinical picture of acute gastroenteritis.  For the details, please address to her history and physical.  HOSPITAL COURSE:  She spent overnight on the medical floor.  She was hydrated with fluids.  Her nausea and vomiting have resolved.  She continues to have occasional loose stools without blood.  Abdominal pain has resolved.  No fever or chills.  On the day of discharge, she is feeling 100% better.  Her temperature 97.8, respirations 20, heart rate 70, blood pressure 109/69.  White count 12.7, which is down from yesterday, hemoglobin 12.7.  Sedimentation rate 13. Creatinine 0.6, potassium 3.6, sodium 138.  C. difficile colitis EIA is still pending.  Her abdomen is soft, nontender.  HEENT with moist mucosa.  Left wrist with dorsal swelling from the IV line  leakage.  Clinically she does not have C. difficile colitis.  If the test comes back positive or if she continues to have diarrhea, will treat with Flagyl.  Her relatives will spend the day wit her today and if there are any problems, they will let us know or bring her back. Dictated by:   Sonda Primes, M.D. [facility] Attending Physician:  Tresa Garter DD:  10/10/01 TD:  10/10/01 Job: [id number]

## 2011-04-19 ENCOUNTER — Other Ambulatory Visit: Payer: Self-pay | Admitting: Internal Medicine

## 2011-04-19 MED ORDER — SUMATRIPTAN SUCCINATE 100 MG PO TABS: 100.0000 mg | ORAL_TABLET | Freq: Every day | ORAL | Status: DC | PRN

## 2011-04-19 NOTE — Telephone Encounter (Signed)
Imitrex 100 mg  #12  Mobic  #90

## 2011-04-19 NOTE — Telephone Encounter (Signed)
imitrex sent in electronically, need new rx for the meloxicam

## 2011-04-19 NOTE — Telephone Encounter (Signed)
Pt called and is req a renewal of Imitrex 100 mg pt take 1/2 a pill prn for pain. Pt also is req to get a script for Meloxicam for fibromyalgia and arthiritis pain. Pls call in to [facility].

## 2011-04-20 MED ORDER — MELOXICAM 7.5 MG PO TABS: 7.5000 mg | ORAL_TABLET | Freq: Every day | ORAL | Status: DC

## 2011-04-20 NOTE — Telephone Encounter (Signed)
rx sent in electronically 

## 2011-04-25 ENCOUNTER — Ambulatory Visit: Payer: BC Managed Care – PPO | Admitting: Internal Medicine

## 2011-07-27 ENCOUNTER — Other Ambulatory Visit: Payer: Self-pay | Admitting: Internal Medicine

## 2011-07-27 NOTE — Telephone Encounter (Signed)
Dr. Swords pt 

## 2011-10-05 ENCOUNTER — Ambulatory Visit (INDEPENDENT_AMBULATORY_CARE_PROVIDER_SITE_OTHER): Payer: Medicare Other | Admitting: Family Medicine

## 2011-10-05 ENCOUNTER — Encounter: Payer: Self-pay | Admitting: Family Medicine

## 2011-10-05 VITALS — BP 128/80 | HR 70 | Temp 98.2°F | Wt 120.0 lb

## 2011-10-05 DIAGNOSIS — J0100 Acute maxillary sinusitis, unspecified: Secondary | ICD-10-CM

## 2011-10-05 MED ORDER — ZITHROMAX Z-PAK 250 MG PO TABS: ORAL_TABLET | ORAL | Status: AC

## 2011-10-05 NOTE — Patient Instructions (Signed)

## 2011-10-05 NOTE — Progress Notes (Signed)
Subjective:    Patient ID: Wanda Downs, female    DOB: Mar 19, 1941, 70 y.o.   MRN: [id number]  HPI 70 year old white female, former smoker, patient of Dr. Cato Mulligan, in with two-day history of sore throat, sinus pressure and pain, cough with productive sputum that is green. Symptoms occurred suddenly, and have progressively worsened. She has a past medical history of acute sinusitis.   Review of Systems  Constitutional: Positive for fatigue.  HENT: Positive for congestion, sneezing, postnasal drip, sinus pressure and ear discharge.   Eyes: Negative.   Respiratory: Positive for cough.   Cardiovascular: Negative.   Gastrointestinal: Negative.   Genitourinary: Negative.   Musculoskeletal: Negative.   Skin: Negative.   Neurological: Negative.   Psychiatric/Behavioral: Negative.        Past Medical History  Diagnosis Date  . Hypothyroidism   . Osteoarthritis   . Celiac disease   . Fibromyalgia   . FHx: migraine headaches   . Elevated LFTs     check Hep B & C  . Depression   . Osteopenia     History   Social History  . Marital Status: Single    Spouse Name: N/A    Number of Children: N/A  . Years of Education: N/A   Occupational History  . Not on file.   Social History Main Topics  . Smoking status: Former Games developer  . Smokeless tobacco: Not on file  . Alcohol Use:   . Drug Use:   . Sexually Active:    Other Topics Concern  . Not on file   Social History Narrative  . No narrative on file    Past Surgical History  Procedure Date  . Abdominal hysterectomy     Bilateral S & O  . Nasal sinus surgery   . Bone spur     left knee  . Knee arthroscopy     right knee  . Bone spur     hand  . Right hand fx   . Bladder surgery   . Breast surgery     Breast implants X 2  . Cosmetic surgery     Plastic surgery (face)  . Excision morton's neuroma     Family History  Problem Relation Age of Onset  . Hypertension Mother   . Arthritis Mother     Allergies    Allergen Reactions  . Penicillins     REACTION: rash, swelling  . Sulfamethoxazole     REACTION: rash, swelling    Current Outpatient Prescriptions on File Prior to Visit  Medication Sig Dispense Refill  . Calcium Carbonate 1500 MG TABS Take by mouth Downs.        . Cholecalciferol (VITAMIN D) 1000 UNITS capsule Take 1,000 Units by mouth Downs.        [name] estradiol (VIVELLE-DOT) 0.1 MG/24HR Place 1 patch onto the skin. Change every 4 days       . meloxicam (MOBIC) 7.5 MG tablet TAKE ONE TABLET EACH DAY  90 tablet  1  . SUMAtriptan (IMITREX) 100 MG tablet Take 1 tablet (100 mg total) by mouth Downs as needed. Take 1/2- 1 tablet  10 tablet  3  . thyroid (ARMOUR) 60 MG tablet Take 60 mg by mouth Downs. Take 1 tablet po Downs of compounded 75 mg       . azelastine (ASTELIN) 137 MCG/SPRAY nasal spray 1 spray by Nasal route 2 (two) times Downs. Use in each nostril as directed       .  fluticasone (FLONASE) 50 MCG/ACT nasal spray 2 sprays by Nasal route Downs.        [name] HYDROcodone-acetaminophen (VICODIN) 5-500 MG per tablet Take 1 tablet by mouth Downs as needed for pain.  30 tablet  0  . traMADol (ULTRAM) 50 MG tablet Take 50 mg by mouth as needed. 1/2 tablet          BP 128/80  Pulse 70  Temp(Src) 98.2 F (36.8 C) (Oral)  Wt 120 lb (54.432 kg)chart Objective:   Physical Exam  Constitutional: She is oriented to person, place, and time. She appears well-developed and well-nourished.  HENT:  Right Ear: External ear normal.  Left Ear: External ear normal.  Nose: Nose normal.  Mouth/Throat: Oropharynx is clear and moist.  Eyes: Pupils are equal, round, and reactive to light.  Neck: Normal range of motion. Neck supple.  Cardiovascular: Normal rate, regular rhythm and normal heart sounds.   Pulmonary/Chest: Effort normal and breath sounds normal.  Neurological: She is alert and oriented to person, place, and time.  Skin: Skin is warm and dry.          Assessment & Plan:   Assessment: Early sinusitis  Plan: Antihistamine over-the-counter once Downs. Z-Pak as directed. Rest. Drink plenty of fluids. Call if symptoms worsen or persist, recheck as scheduled and when necessary.

## 2011-10-18 DIAGNOSIS — K900 Celiac disease: Secondary | ICD-10-CM | POA: Diagnosis not present

## 2011-11-21 DIAGNOSIS — K900 Celiac disease: Secondary | ICD-10-CM | POA: Diagnosis not present

## 2011-11-23 ENCOUNTER — Encounter: Payer: Self-pay | Admitting: Family

## 2011-11-23 ENCOUNTER — Ambulatory Visit (INDEPENDENT_AMBULATORY_CARE_PROVIDER_SITE_OTHER): Payer: Medicare Other | Admitting: Family

## 2011-11-23 VITALS — BP 124/80 | Temp 97.7°F | Wt 124.0 lb

## 2011-11-23 DIAGNOSIS — J0100 Acute maxillary sinusitis, unspecified: Secondary | ICD-10-CM | POA: Diagnosis not present

## 2011-11-23 DIAGNOSIS — R0982 Postnasal drip: Secondary | ICD-10-CM | POA: Diagnosis not present

## 2011-11-23 DIAGNOSIS — J309 Allergic rhinitis, unspecified: Secondary | ICD-10-CM | POA: Diagnosis not present

## 2011-11-23 MED ORDER — AZITHROMYCIN 250 MG PO TABS: ORAL_TABLET | ORAL | Status: AC

## 2011-11-23 NOTE — Patient Instructions (Addendum)
1. Claritin 1/2 tab a day   Sinusitis Sinuses are air pockets within the bones of your face. The growth of bacteria within a sinus leads to infection. The infection prevents the sinuses from draining. This infection is called sinusitis. SYMPTOMS  There will be different areas of pain depending on which sinuses have become infected.  The maxillary sinuses often produce pain beneath the eyes.   Frontal sinusitis may cause pain in the middle of the forehead and above the eyes.  Other problems (symptoms) include:  Toothaches.   Colored, pus-like (purulent) drainage from the nose.   Swelling, warmth, and tenderness over the sinus areas may be signs of infection.  TREATMENT  Sinusitis is most often determined by an exam.X-rays may be taken. If x-rays have been taken, make sure you obtain your results or find out how you are to obtain them. Your caregiver may give you medications (antibiotics). These are medications that will help kill the bacteria causing the infection. You may also be given a medication (decongestant) that helps to reduce sinus swelling.  HOME CARE INSTRUCTIONS   Only take over-the-counter or prescription medicines for pain, discomfort, or fever as directed by your caregiver.   Drink extra fluids. Fluids help thin the mucus so your sinuses can drain more easily.   Applying either moist heat or ice packs to the sinus areas may help relieve discomfort.   Use saline nasal sprays to help moisten your sinuses. The sprays can be found at your local drugstore.  SEEK IMMEDIATE MEDICAL CARE IF:  You have a fever.   You have increasing pain, severe headaches, or toothache.   You have nausea, vomiting, or drowsiness.   You develop unusual swelling around the face or trouble seeing.  MAKE SURE YOU:   Understand these instructions.   Will watch your condition.   Will get help right away if you are not doing well or get worse.  Document Released: 09/26/2005 Document  Revised: 06/08/2011 Document Reviewed: 04/25/2007 [employer] Patient Information 2012 [place], [place].

## 2011-11-23 NOTE — Progress Notes (Signed)
Subjective:    Patient ID: Wanda Downs, female    DOB: 1941-02-08, 71 y.o.   MRN: [id number]  HPI 71 year old white female, nonsmoker, patient of Dr. Cato Mulligan is in today with complaint of sneezing, cough, congestion, sinus pressure and pain has been ordered for one week and worsening. She has not taken any medication over-the-counter due to celiac disease. She had difficulty tolerating medications. Denies any lightheadedness, dizziness, chest pain, palpitations, shortness of breath or edema.   Review of Systems  Constitutional: Positive for fever and fatigue.  HENT: Positive for sore throat, rhinorrhea, sneezing, postnasal drip and sinus pressure.   Eyes: Negative.   Respiratory: Positive for cough.   Cardiovascular: Negative.   Gastrointestinal: Negative.   Musculoskeletal: Negative.   Skin: Negative.   Neurological: Negative.   Hematological: Negative.   Psychiatric/Behavioral: Negative.    Past Medical History  Diagnosis Date  . Hypothyroidism   . Osteoarthritis   . Celiac disease   . Fibromyalgia   . FHx: migraine headaches   . Elevated LFTs     check Hep B & C  . Depression   . Osteopenia     History   Social History  . Marital Status: Single    Spouse Name: N/A    Number of Children: N/A  . Years of Education: N/A   Occupational History  . Not on file.   Social History Main Topics  . Smoking status: Former Games developer  . Smokeless tobacco: Not on file  . Alcohol Use:   . Drug Use:   . Sexually Active:    Other Topics Concern  . Not on file   Social History Narrative  . No narrative on file    Past Surgical History  Procedure Date  . Abdominal hysterectomy     Bilateral S & O  . Nasal sinus surgery   . Bone spur     left knee  . Knee arthroscopy     right knee  . Bone spur     hand  . Right hand fx   . Bladder surgery   . Breast surgery     Breast implants X 2  . Cosmetic surgery     Plastic surgery (face)  . Excision morton's neuroma       Family History  Problem Relation Age of Onset  . Hypertension Mother   . Arthritis Mother     Allergies  Allergen Reactions  . Penicillins     REACTION: rash, swelling  . Sulfamethoxazole     REACTION: rash, swelling    Current Outpatient Prescriptions on File Prior to Visit  Medication Sig Dispense Refill  . azelastine (ASTELIN) 137 MCG/SPRAY nasal spray 1 spray by Nasal route 2 (two) times Downs. Use in each nostril as directed       . Calcium Carbonate 1500 MG TABS Take by mouth Downs.        . Cholecalciferol (VITAMIN D) 1000 UNITS capsule Take 1,000 Units by mouth Downs.        [name] estradiol (VIVELLE-DOT) 0.1 MG/24HR Place 1 patch onto the skin. Change every 4 days       . fluticasone (FLONASE) 50 MCG/ACT nasal spray 2 sprays by Nasal route Downs.        [name] HYDROcodone-acetaminophen (VICODIN) 5-500 MG per tablet Take 1 tablet by mouth Downs as needed for pain.  30 tablet  0  . meloxicam (MOBIC) 7.5 MG tablet TAKE ONE TABLET EACH DAY  90 tablet  1  .  SUMAtriptan (IMITREX) 100 MG tablet Take 1 tablet (100 mg total) by mouth Downs as needed. Take 1/2- 1 tablet  10 tablet  3  . thyroid (ARMOUR) 60 MG tablet Take 60 mg by mouth Downs. Take 1 tablet po Downs of compounded 75 mg       . traMADol (ULTRAM) 50 MG tablet Take 50 mg by mouth as needed. 1/2 tablet          BP 124/80  Temp(Src) 97.7 F (36.5 C) (Oral)  Wt 124 lb (56.246 kg)chart    Objective:   Physical Exam  Constitutional: She is oriented to person, place, and time. She appears well-developed and well-nourished.  HENT:  Right Ear: External ear normal.  Left Ear: External ear normal.  Nose: Nose normal.  Mouth/Throat: Oropharynx is clear and moist.       Frontal sinus tenderness to palpation.  Neck: Normal range of motion. Neck supple.  Cardiovascular: Normal rate, regular rhythm and normal heart sounds.   Pulmonary/Chest: Effort normal and breath sounds normal.  Neurological: She is alert and oriented to  person, place, and time.  Skin: Skin is warm and dry.  Psychiatric: She has a normal mood and affect.          Assessment & Plan:  Assessment: Acute sinusitis, allergic rhinitis  Plan: Claritin she will start at half a tablet by mouth Downs to try to control her symptoms. Z-Pak as directed. Patient to call the office is symptoms worsen or persist number recheck as scheduled or when necessary.

## 2012-01-03 ENCOUNTER — Other Ambulatory Visit: Payer: Self-pay

## 2012-01-03 DIAGNOSIS — K130 Diseases of lips: Secondary | ICD-10-CM | POA: Diagnosis not present

## 2012-01-03 DIAGNOSIS — L819 Disorder of pigmentation, unspecified: Secondary | ICD-10-CM | POA: Diagnosis not present

## 2012-01-03 DIAGNOSIS — D485 Neoplasm of uncertain behavior of skin: Secondary | ICD-10-CM | POA: Diagnosis not present

## 2012-01-03 DIAGNOSIS — L570 Actinic keratosis: Secondary | ICD-10-CM | POA: Diagnosis not present

## 2012-02-07 DIAGNOSIS — E559 Vitamin D deficiency, unspecified: Secondary | ICD-10-CM | POA: Diagnosis not present

## 2012-02-07 DIAGNOSIS — K900 Celiac disease: Secondary | ICD-10-CM | POA: Diagnosis not present

## 2012-02-07 DIAGNOSIS — E039 Hypothyroidism, unspecified: Secondary | ICD-10-CM | POA: Diagnosis not present

## 2012-02-07 DIAGNOSIS — R7989 Other specified abnormal findings of blood chemistry: Secondary | ICD-10-CM | POA: Diagnosis not present

## 2012-02-09 ENCOUNTER — Other Ambulatory Visit: Payer: Self-pay | Admitting: Internal Medicine

## 2012-02-09 MED ORDER — DICLOFENAC SODIUM 1 % TD GEL: 1.0000 "application " | Freq: Two times a day (BID) | TRANSDERMAL | Status: DC | PRN

## 2012-02-09 NOTE — Telephone Encounter (Signed)
voltaren gel, use bid prn arthritic pain.

## 2012-02-09 NOTE — Telephone Encounter (Signed)
Pt is requesting voltaren gel for hands. Pt has arthritis in hands brown gardiner phar

## 2012-02-09 NOTE — Telephone Encounter (Signed)
rx sent in electronically 

## 2012-02-16 ENCOUNTER — Ambulatory Visit: Payer: BC Managed Care – PPO | Admitting: Internal Medicine

## 2012-02-20 DIAGNOSIS — K900 Celiac disease: Secondary | ICD-10-CM | POA: Diagnosis not present

## 2012-03-07 ENCOUNTER — Encounter: Payer: Self-pay | Admitting: Internal Medicine

## 2012-03-07 ENCOUNTER — Ambulatory Visit (INDEPENDENT_AMBULATORY_CARE_PROVIDER_SITE_OTHER): Payer: Medicare Other | Admitting: Internal Medicine

## 2012-03-07 DIAGNOSIS — Z2911 Encounter for prophylactic immunotherapy for respiratory syncytial virus (RSV): Secondary | ICD-10-CM

## 2012-03-07 DIAGNOSIS — M810 Age-related osteoporosis without current pathological fracture: Secondary | ICD-10-CM | POA: Diagnosis not present

## 2012-03-07 DIAGNOSIS — IMO0001 Reserved for inherently not codable concepts without codable children: Secondary | ICD-10-CM

## 2012-03-07 DIAGNOSIS — Z1231 Encounter for screening mammogram for malignant neoplasm of breast: Secondary | ICD-10-CM

## 2012-03-07 DIAGNOSIS — E039 Hypothyroidism, unspecified: Secondary | ICD-10-CM | POA: Diagnosis not present

## 2012-03-07 DIAGNOSIS — Z0000 Encounter for general adult medical examination without abnormal findings: Secondary | ICD-10-CM | POA: Diagnosis not present

## 2012-03-07 DIAGNOSIS — K900 Celiac disease: Secondary | ICD-10-CM

## 2012-03-07 DIAGNOSIS — M1990 Unspecified osteoarthritis, unspecified site: Secondary | ICD-10-CM

## 2012-03-07 LAB — HEPATIC FUNCTION PANEL
Albumin: 3.9 g/dL (ref 3.5–5.2)
Bilirubin, Direct: 0 mg/dL (ref 0.0–0.3)
Total Bilirubin: 0.6 mg/dL (ref 0.3–1.2)
Total Protein: 7.1 g/dL (ref 6.0–8.3)

## 2012-03-07 LAB — BASIC METABOLIC PANEL
BUN: 12 mg/dL (ref 6–23)
CO2: 30 mEq/L (ref 19–32)
Chloride: 100 mEq/L (ref 96–112)
Creatinine, Ser: 0.7 mg/dL (ref 0.4–1.2)
Glucose, Bld: 86 mg/dL (ref 70–99)
Potassium: 4.5 mEq/L (ref 3.5–5.1)
Sodium: 135 mEq/L (ref 135–145)

## 2012-03-07 LAB — TSH: TSH: 1.63 u[IU]/mL (ref 0.35–5.50)

## 2012-03-07 LAB — CBC WITH DIFFERENTIAL/PLATELET
Eosinophils Relative: 2.8 % (ref 0.0–5.0)
MCV: 91.7 fl (ref 78.0–100.0)
Monocytes Relative: 7.1 % (ref 3.0–12.0)
Neutro Abs: 4.7 10*3/uL (ref 1.4–7.7)

## 2012-03-07 MED ORDER — DOXYCYCLINE HYCLATE 100 MG PO TABS: 100.0000 mg | ORAL_TABLET | Freq: Two times a day (BID) | ORAL | Status: AC

## 2012-03-07 MED ORDER — GABAPENTIN 300 MG PO CAPS: 300.0000 mg | ORAL_CAPSULE | Freq: Three times a day (TID) | ORAL | Status: DC

## 2012-03-07 NOTE — Assessment & Plan Note (Signed)
Check labs today.

## 2012-03-07 NOTE — Assessment & Plan Note (Signed)
Needs labs

## 2012-03-07 NOTE — Progress Notes (Signed)
Patient ID: Wanda Downs, female   DOB: May 14, 1941, 71 y.o.   MRN: [id number]  URI- sxs ongoing for 1 week. No fever or chills. Purulent mucous from left nares with maxillary pain.  OA--- chronic pain, she takes nsaids prn, she tries to play golf. A lot of pain in hands. Chronic low dose prednisone.  Sprue--- follows a strict diet, doing well.   Past Medical History  Diagnosis Date  . Hypothyroidism   . Osteoarthritis   . Celiac disease   . Fibromyalgia   . FHx: migraine headaches   . Elevated LFTs     check Hep B & C  . Depression   . Osteopenia     History   Social History  . Marital Status: Single    Spouse Name: N/A    Number of Children: N/A  . Years of Education: N/A   Occupational History  . Not on file.   Social History Main Topics  . Smoking status: Former Games developer  . Smokeless tobacco: Not on file  . Alcohol Use:   . Drug Use:   . Sexually Active:    Other Topics Concern  . Not on file   Social History Narrative  . No narrative on file    Past Surgical History  Procedure Date  . Abdominal hysterectomy     Bilateral S & O  . Nasal sinus surgery   . Bone spur     left knee  . Knee arthroscopy     right knee  . Bone spur     hand  . Right hand fx   . Bladder surgery   . Breast surgery     Breast implants X 2  . Cosmetic surgery     Plastic surgery (face)  . Excision morton's neuroma     Family History  Problem Relation Age of Onset  . Hypertension Mother   . Arthritis Mother     Allergies  Allergen Reactions  . Penicillins     REACTION: rash, swelling  . Sulfamethoxazole     REACTION: rash, swelling    Current Outpatient Prescriptions on File Prior to Visit  Medication Sig Dispense Refill  . Calcium Carbonate 1500 MG TABS Take by mouth daily.        . Cholecalciferol (VITAMIN D) 1000 UNITS capsule Take 1,000 Units by mouth daily.        . diclofenac sodium (VOLTAREN) 1 % GEL Apply 1 application topically 2 (two) times daily as  needed. For arthritic pain  100 g  1  . estradiol (VIVELLE-DOT) 0.1 MG/24HR Place 1 patch onto the skin. Change every 4 days       . fluticasone (FLONASE) 50 MCG/ACT nasal spray 2 sprays by Nasal route daily.        . meloxicam (MOBIC) 7.5 MG tablet TAKE ONE TABLET EACH DAY  90 tablet  1  . SUMAtriptan (IMITREX) 100 MG tablet Take 1 tablet (100 mg total) by mouth daily as needed. Take 1/2- 1 tablet  10 tablet  3  . thyroid (ARMOUR) 60 MG tablet Take 60 mg by mouth daily. Take 1 tablet po daily of compounded 75 mg       . gabapentin (NEURONTIN) 300 MG capsule Take 1 capsule (300 mg total) by mouth 3 (three) times daily. Or as directed  90 capsule  3     patient denies chest pain, shortness of breath, orthopnea. Denies lower extremity edema, abdominal pain, change in appetite, change in  bowel movements. Patient denies rashes, musculoskeletal complaints. No other specific complaints in a complete review of systems.   BP 132/82  Pulse 72  Temp(Src) 98.2 F (36.8 C) (Oral)  Wt 124 lb (56.246 kg)  Well-developed well-nourished female in no acute distress. HEENT exam atraumatic, normocephalic, extraocular muscles are intact. Neck is supple. No jugular venous distention no thyromegaly. Chest clear to auscultation without increased work of breathing. Cardiac exam S1 and S2 are regular. Abdominal exam active bowel sounds, soft, nontender. Extremities no edema. Neurologic exam she is alert without any motor sensory deficits. Gait is normal.

## 2012-03-07 NOTE — Assessment & Plan Note (Signed)
Chronic pain- will try neurontin

## 2012-03-07 NOTE — Assessment & Plan Note (Signed)
She has a lot of pain Reasonable to try gabapentin

## 2012-03-07 NOTE — Patient Instructions (Signed)
Gabapentin 300 mg at night for 2 weeks and then two times daily.

## 2012-03-15 ENCOUNTER — Telehealth: Payer: Self-pay | Admitting: Internal Medicine

## 2012-03-15 DIAGNOSIS — M1990 Unspecified osteoarthritis, unspecified site: Secondary | ICD-10-CM

## 2012-03-15 MED ORDER — FLUTICASONE PROPIONATE 50 MCG/ACT NA SUSP: 2.0000 | Freq: Every day | NASAL | Status: AC

## 2012-03-15 NOTE — Telephone Encounter (Signed)
Flonase sent in electronically.  Ok to for the meloxicam?

## 2012-03-15 NOTE — Telephone Encounter (Signed)
Pt aware, medicine removed from med list

## 2012-03-15 NOTE — Telephone Encounter (Signed)
Ok to call in meloxicam 7.5 mg po qd #30/3 refills, take with food,  and flonase. Remove gabapentin from med list (side effect, drowsy)

## 2012-03-15 NOTE — Telephone Encounter (Signed)
Patient called stating that her flonase was not called in from her last visit. Patient also states that the gabapetin makes her drowsy and she would like to have meloxicam called in instead. Please assist.

## 2012-03-15 NOTE — Telephone Encounter (Signed)
Addended by: Alfred Levins D on: 03/15/2012 02:27 PM   Modules accepted: Orders

## 2012-03-21 ENCOUNTER — Other Ambulatory Visit: Payer: Self-pay | Admitting: Internal Medicine

## 2012-03-22 ENCOUNTER — Telehealth: Payer: Self-pay | Admitting: Internal Medicine

## 2012-03-22 DIAGNOSIS — M79644 Pain in right finger(s): Secondary | ICD-10-CM

## 2012-03-22 NOTE — Telephone Encounter (Signed)
Pt is having issues with her R thumb arthritis and it is becoming deformed and is very painful. Pt called [facility] to try and get an appt could not given her an appt until 04/20/12. Pt requesting you contact her

## 2012-03-22 NOTE — Telephone Encounter (Addendum)
Pt wants to see if she can get in somewhere else.  Order placed

## 2012-03-26 ENCOUNTER — Other Ambulatory Visit: Payer: Self-pay | Admitting: Internal Medicine

## 2012-03-28 DIAGNOSIS — M069 Rheumatoid arthritis, unspecified: Secondary | ICD-10-CM | POA: Diagnosis not present

## 2012-03-28 DIAGNOSIS — L4050 Arthropathic psoriasis, unspecified: Secondary | ICD-10-CM | POA: Diagnosis not present

## 2012-03-28 DIAGNOSIS — M109 Gout, unspecified: Secondary | ICD-10-CM | POA: Diagnosis not present

## 2012-03-29 ENCOUNTER — Other Ambulatory Visit (INDEPENDENT_AMBULATORY_CARE_PROVIDER_SITE_OTHER): Payer: Medicare Other

## 2012-03-29 DIAGNOSIS — M069 Rheumatoid arthritis, unspecified: Secondary | ICD-10-CM

## 2012-03-29 LAB — URIC ACID: Uric Acid, Serum: 3.9 mg/dL (ref 2.4–7.0)

## 2012-03-29 LAB — SEDIMENTATION RATE: Sed Rate: 8 mm/hr (ref 0–22)

## 2012-03-30 LAB — CYCLIC CITRUL PEPTIDE ANTIBODY, IGG: Cyclic Citrullin Peptide Ab: <2 U/mL (ref 0.0–5.0)

## 2012-03-30 LAB — RHEUMATOID FACTOR: Rhuematoid fact SerPl-aCnc: <10 IU/mL (ref <=14)

## 2012-04-02 ENCOUNTER — Ambulatory Visit (INDEPENDENT_AMBULATORY_CARE_PROVIDER_SITE_OTHER)
Admission: RE | Admit: 2012-04-02 | Discharge: 2012-04-02 | Disposition: A | Discharge status: Home / Self Care | Payer: Medicare Other | Source: Ambulatory Visit

## 2012-04-02 DIAGNOSIS — M810 Age-related osteoporosis without current pathological fracture: Secondary | ICD-10-CM

## 2012-04-11 DIAGNOSIS — M19049 Primary osteoarthritis, unspecified hand: Secondary | ICD-10-CM | POA: Diagnosis not present

## 2012-04-19 ENCOUNTER — Ambulatory Visit
Admission: RE | Admit: 2012-04-19 | Discharge: 2012-04-19 | Disposition: A | Discharge status: Home / Self Care | Payer: Medicare Other | Source: Ambulatory Visit | Attending: Internal Medicine | Admitting: Internal Medicine

## 2012-04-19 DIAGNOSIS — Z1231 Encounter for screening mammogram for malignant neoplasm of breast: Secondary | ICD-10-CM | POA: Diagnosis not present

## 2012-04-25 DIAGNOSIS — S91109A Unspecified open wound of unspecified toe(s) without damage to nail, initial encounter: Secondary | ICD-10-CM | POA: Diagnosis not present

## 2012-05-03 ENCOUNTER — Other Ambulatory Visit: Payer: Self-pay | Admitting: Internal Medicine

## 2012-05-07 DIAGNOSIS — L570 Actinic keratosis: Secondary | ICD-10-CM | POA: Diagnosis not present

## 2012-05-14 DIAGNOSIS — IMO0002 Reserved for concepts with insufficient information to code with codable children: Secondary | ICD-10-CM | POA: Diagnosis not present

## 2012-05-14 DIAGNOSIS — L97509 Non-pressure chronic ulcer of other part of unspecified foot with unspecified severity: Secondary | ICD-10-CM | POA: Diagnosis not present

## 2012-05-17 DIAGNOSIS — R5381 Other malaise: Secondary | ICD-10-CM | POA: Diagnosis not present

## 2012-05-17 DIAGNOSIS — M1990 Unspecified osteoarthritis, unspecified site: Secondary | ICD-10-CM | POA: Diagnosis not present

## 2012-05-17 DIAGNOSIS — M545 Low back pain, unspecified: Secondary | ICD-10-CM | POA: Diagnosis not present

## 2012-05-17 DIAGNOSIS — IMO0001 Reserved for inherently not codable concepts without codable children: Secondary | ICD-10-CM | POA: Diagnosis not present

## 2012-05-17 DIAGNOSIS — R5383 Other fatigue: Secondary | ICD-10-CM | POA: Diagnosis not present

## 2012-05-17 DIAGNOSIS — M064 Inflammatory polyarthropathy: Secondary | ICD-10-CM | POA: Diagnosis not present

## 2012-05-17 DIAGNOSIS — M2550 Pain in unspecified joint: Secondary | ICD-10-CM | POA: Diagnosis not present

## 2012-05-21 DIAGNOSIS — K900 Celiac disease: Secondary | ICD-10-CM | POA: Diagnosis not present

## 2012-06-13 ENCOUNTER — Ambulatory Visit (INDEPENDENT_AMBULATORY_CARE_PROVIDER_SITE_OTHER): Payer: Medicare Other | Admitting: Internal Medicine

## 2012-06-13 ENCOUNTER — Encounter: Payer: Self-pay | Admitting: Internal Medicine

## 2012-06-13 VITALS — BP 132/88 | Temp 97.8°F | Wt 125.0 lb

## 2012-06-13 DIAGNOSIS — R05 Cough: Secondary | ICD-10-CM | POA: Diagnosis not present

## 2012-06-13 DIAGNOSIS — R053 Chronic cough: Secondary | ICD-10-CM

## 2012-06-13 DIAGNOSIS — R059 Cough, unspecified: Secondary | ICD-10-CM | POA: Diagnosis not present

## 2012-06-13 MED ORDER — ESOMEPRAZOLE MAGNESIUM 40 MG PO CPDR: 40.0000 mg | DELAYED_RELEASE_CAPSULE | Freq: Every day | ORAL | Status: DC

## 2012-06-13 NOTE — Assessment & Plan Note (Signed)
71 year old white female with chronic nonproductive cough for greater than 6 months. Obtain chest x-ray to rule out pulmonary lesion. She has history of chronic GERD. This may be potential trigger. Trial of Nexium 40 mg once daily 15 minutes before a meal. Reassess in one month.

## 2012-06-13 NOTE — Progress Notes (Signed)
Subjective:    Patient ID: Wanda Downs, female    DOB: 02/25/1941, 71 y.o.   MRN: [id number]  HPI  71 year old white female with history of celiac disease, hypothyroidism and fibromyalgia complains of chronic cough since December 2012. Her symptoms were fairly mild when they started but over the last 3 weeks her cough has increased in frequency. Her cough is nonproductive. Her symptoms seem to be worse when she's laying down.  Patient complains of chronic Downs reflux symptoms. She takes meloxicam 7.5 mg Downs for osteoarthritis pain.  She is a nonsmoker and has not been exposed to second hand smoke.  Review of Systems No significant weight changes, appetite is normal History of allergic rhinitis  Past Medical History  Diagnosis Date  . Hypothyroidism   . Osteoarthritis   . Celiac disease   . Fibromyalgia   . FHx: migraine headaches   . Elevated LFTs     check Hep B & C  . Depression   . Osteopenia     History   Social History  . Marital Status: Single    Spouse Name: N/A    Number of Children: N/A  . Years of Education: N/A   Occupational History  . Not on file.   Social History Main Topics  . Smoking status: Former Games developer  . Smokeless tobacco: Not on file  . Alcohol Use:   . Drug Use:   . Sexually Active:    Other Topics Concern  . Not on file   Social History Narrative  . No narrative on file    Past Surgical History  Procedure Date  . Abdominal hysterectomy     Bilateral S & O  . Nasal sinus surgery   . Bone spur     left knee  . Knee arthroscopy     right knee  . Bone spur     hand  . Right hand fx   . Bladder surgery   . Breast surgery     Breast implants X 2  . Cosmetic surgery     Plastic surgery (face)  . Excision morton's neuroma     Family History  Problem Relation Age of Onset  . Hypertension Mother   . Arthritis Mother     Allergies  Allergen Reactions  . Penicillins     REACTION: rash, swelling  . Sulfamethoxazole      REACTION: rash, swelling    Current Outpatient Prescriptions on File Prior to Visit  Medication Sig Dispense Refill  . Calcium Carbonate 1500 MG TABS Take by mouth Downs.        . Cholecalciferol (VITAMIN D) 1000 UNITS capsule Take 1,000 Units by mouth Downs.        [name] estradiol (VIVELLE-DOT) 0.1 MG/24HR Place 1 patch onto the skin. Change every 4 days       . fluticasone (FLONASE) 50 MCG/ACT nasal spray Place 2 sprays into the nose Downs.  16 g  5  . meloxicam (MOBIC) 7.5 MG tablet TAKE ONE TABLET EVERY DAY  90 tablet  1  . SUMAtriptan (IMITREX) 100 MG tablet Take 1 tablet (100 mg total) by mouth Downs as needed. Take 1/2- 1 tablet  10 tablet  3  . thyroid (ARMOUR) 60 MG tablet Take 60 mg by mouth Downs. Take 1 tablet po Downs of compounded 75 mg       . VOLTAREN 1 % GEL APPLY TOPICALLY TWICE Downs AS NEEDED   FOR ARTHRITIC PAIN  100 g  0  . esomeprazole (NEXIUM) 40 MG capsule Take 1 capsule (40 mg total) by mouth Downs.  30 capsule  3    BP 132/88  Temp 97.8 F (36.6 C) (Oral)  Wt 125 lb (56.7 kg)       Objective:   Physical Exam  Constitutional: She is oriented to person, place, and time. She appears well-developed and well-nourished.  HENT:  Head: Normocephalic and atraumatic.  Right Ear: External ear normal.  Left Ear: External ear normal.  Mouth/Throat: Oropharynx is clear and moist.  Eyes: EOM are normal. Pupils are equal, round, and reactive to light.  Neck: Neck supple.       No carotid bruit  Cardiovascular: Normal rate, regular rhythm and normal heart sounds.   Pulmonary/Chest: Effort normal and breath sounds normal. She has no wheezes.  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  Neurological: She is alert and oriented to person, place, and time. No cranial nerve deficit.  Psychiatric: She has a normal mood and affect. Her behavior is normal.       Assessment & Plan:

## 2012-06-13 NOTE — Patient Instructions (Addendum)
Take Nexium 40 mg 15 minutes before breakfast on empty stomach Office will contact you re: chest x-ray results

## 2012-06-18 DIAGNOSIS — M479 Spondylosis, unspecified: Secondary | ICD-10-CM | POA: Diagnosis not present

## 2012-06-18 DIAGNOSIS — IMO0001 Reserved for inherently not codable concepts without codable children: Secondary | ICD-10-CM | POA: Diagnosis not present

## 2012-06-18 DIAGNOSIS — M1990 Unspecified osteoarthritis, unspecified site: Secondary | ICD-10-CM | POA: Diagnosis not present

## 2012-06-18 DIAGNOSIS — Z1589 Genetic susceptibility to other disease: Secondary | ICD-10-CM | POA: Diagnosis not present

## 2012-06-18 DIAGNOSIS — R5381 Other malaise: Secondary | ICD-10-CM | POA: Diagnosis not present

## 2012-06-25 ENCOUNTER — Telehealth: Payer: Self-pay

## 2012-06-25 NOTE — Telephone Encounter (Signed)
Switched care to [facility]

## 2012-06-25 NOTE — Telephone Encounter (Signed)
Message copied by Donata Duff on Mon Jun 25, 2012  8:17 AM ------      Message from: Rob Bunting P      Created: Mon Jun 25, 2012  8:14 AM       I did colonoscopy 2007 for "adenomatous polyps in [place] 8 years prior."  No polyps noted, recommeded repeat colonoscopy at 5 year interval.  I found [facility] 09/2009 with no polyps found.              Please call her, ask if she is planning to stay with [facility] for her GI care or here. If here, then NEW gi with me to help determine appropriate interval for next colonoscopy.            Thanks            ----- Message -----         From: Donata Duff, CMA         Sent: 06/22/2012   4:28 PM           To: Rachael Fee, MD            Dr Christella Hartigan this pt is on the recall list and I see no mention of any recall,  When should she have repeat procedure?

## 2012-07-05 ENCOUNTER — Other Ambulatory Visit: Payer: Self-pay | Admitting: Internal Medicine

## 2012-07-05 ENCOUNTER — Telehealth: Payer: Self-pay | Admitting: Internal Medicine

## 2012-07-05 NOTE — Telephone Encounter (Signed)
Call pt - I advise she complete her chest x ray

## 2012-07-05 NOTE — Telephone Encounter (Signed)
Pt will go first thing in the morning

## 2012-07-05 NOTE — Telephone Encounter (Signed)
We received notification that this pt did not complete chest films you ordered 06/13/12.

## 2012-07-06 ENCOUNTER — Ambulatory Visit (INDEPENDENT_AMBULATORY_CARE_PROVIDER_SITE_OTHER)
Admission: RE | Admit: 2012-07-06 | Discharge: 2012-07-06 | Disposition: A | Discharge status: Home / Self Care | Payer: Medicare Other | Source: Ambulatory Visit | Attending: Internal Medicine | Admitting: Internal Medicine

## 2012-07-06 DIAGNOSIS — R05 Cough: Secondary | ICD-10-CM | POA: Diagnosis not present

## 2012-07-06 DIAGNOSIS — R059 Cough, unspecified: Secondary | ICD-10-CM

## 2012-07-06 DIAGNOSIS — R053 Chronic cough: Secondary | ICD-10-CM

## 2012-07-16 ENCOUNTER — Encounter: Payer: Self-pay | Admitting: Internal Medicine

## 2012-07-16 ENCOUNTER — Ambulatory Visit (INDEPENDENT_AMBULATORY_CARE_PROVIDER_SITE_OTHER): Payer: Medicare Other | Admitting: Internal Medicine

## 2012-07-16 VITALS — BP 126/72 | HR 80 | Temp 98.1°F | Wt 125.0 lb

## 2012-07-16 DIAGNOSIS — K900 Celiac disease: Secondary | ICD-10-CM

## 2012-07-16 DIAGNOSIS — M1990 Unspecified osteoarthritis, unspecified site: Secondary | ICD-10-CM

## 2012-07-16 DIAGNOSIS — M479 Spondylosis, unspecified: Secondary | ICD-10-CM | POA: Diagnosis not present

## 2012-07-16 DIAGNOSIS — Z23 Encounter for immunization: Secondary | ICD-10-CM

## 2012-07-16 DIAGNOSIS — M47899 Other spondylosis, site unspecified: Secondary | ICD-10-CM

## 2012-07-16 NOTE — Progress Notes (Signed)
Patient ID: Wanda Downs, female   DOB: Sep 30, 1941, 71 y.o.   MRN: [id number]  Cough is better-- still with minimal cough.   Celiac-- has f/u with GI  Hypothyroid - needs f/u  OA- diffuse. She is seeing rheumatology  Reviewed, pmh, pshx, soc hx,    Well-developed well-nourished female in no acute distress. HEENT exam atraumatic, normocephalic, extraocular muscles are intact. Neck is supple. No jugular venous distention no thyromegaly. Chest clear to auscultation without increased work of breathing. Cardiac exam S1 and S2 are regular. Abdominal exam active bowel sounds, soft, nontender. Extremities no edema-has heberden's/bouchards nodes. Neurologic exam she is alert without any motor sensory deficits. Gait is normal.

## 2012-07-16 NOTE — Assessment & Plan Note (Signed)
Has regular f/u with GI 

## 2012-07-16 NOTE — Assessment & Plan Note (Signed)
Followed by rheumatology. 

## 2012-07-16 NOTE — Assessment & Plan Note (Signed)
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

## 2012-07-23 DIAGNOSIS — M479 Spondylosis, unspecified: Secondary | ICD-10-CM | POA: Diagnosis not present

## 2012-07-23 DIAGNOSIS — M1990 Unspecified osteoarthritis, unspecified site: Secondary | ICD-10-CM | POA: Diagnosis not present

## 2012-07-23 DIAGNOSIS — IMO0001 Reserved for inherently not codable concepts without codable children: Secondary | ICD-10-CM | POA: Diagnosis not present

## 2012-07-23 DIAGNOSIS — M2550 Pain in unspecified joint: Secondary | ICD-10-CM | POA: Diagnosis not present

## 2012-07-23 DIAGNOSIS — Z1589 Genetic susceptibility to other disease: Secondary | ICD-10-CM | POA: Diagnosis not present

## 2012-07-23 DIAGNOSIS — R5383 Other fatigue: Secondary | ICD-10-CM | POA: Diagnosis not present

## 2012-07-23 DIAGNOSIS — R5381 Other malaise: Secondary | ICD-10-CM | POA: Diagnosis not present

## 2012-07-25 DIAGNOSIS — R52 Pain, unspecified: Secondary | ICD-10-CM | POA: Diagnosis not present

## 2012-07-30 DIAGNOSIS — R52 Pain, unspecified: Secondary | ICD-10-CM | POA: Diagnosis not present

## 2012-08-01 DIAGNOSIS — R52 Pain, unspecified: Secondary | ICD-10-CM | POA: Diagnosis not present

## 2012-08-06 DIAGNOSIS — R52 Pain, unspecified: Secondary | ICD-10-CM | POA: Diagnosis not present

## 2012-08-08 DIAGNOSIS — R52 Pain, unspecified: Secondary | ICD-10-CM | POA: Diagnosis not present

## 2012-08-13 DIAGNOSIS — R52 Pain, unspecified: Secondary | ICD-10-CM | POA: Diagnosis not present

## 2012-08-15 DIAGNOSIS — R52 Pain, unspecified: Secondary | ICD-10-CM | POA: Diagnosis not present

## 2012-08-20 DIAGNOSIS — R52 Pain, unspecified: Secondary | ICD-10-CM | POA: Diagnosis not present

## 2012-08-21 DIAGNOSIS — K900 Celiac disease: Secondary | ICD-10-CM | POA: Diagnosis not present

## 2012-08-22 DIAGNOSIS — R52 Pain, unspecified: Secondary | ICD-10-CM | POA: Diagnosis not present

## 2012-08-27 DIAGNOSIS — R52 Pain, unspecified: Secondary | ICD-10-CM | POA: Diagnosis not present

## 2012-08-29 DIAGNOSIS — R52 Pain, unspecified: Secondary | ICD-10-CM | POA: Diagnosis not present

## 2012-09-03 DIAGNOSIS — R52 Pain, unspecified: Secondary | ICD-10-CM | POA: Diagnosis not present

## 2012-09-05 DIAGNOSIS — R52 Pain, unspecified: Secondary | ICD-10-CM | POA: Diagnosis not present

## 2012-09-20 DIAGNOSIS — Z85828 Personal history of other malignant neoplasm of skin: Secondary | ICD-10-CM | POA: Diagnosis not present

## 2012-09-20 DIAGNOSIS — L570 Actinic keratosis: Secondary | ICD-10-CM | POA: Diagnosis not present

## 2012-09-21 DIAGNOSIS — H02059 Trichiasis without entropian unspecified eye, unspecified eyelid: Secondary | ICD-10-CM | POA: Diagnosis not present

## 2012-09-21 DIAGNOSIS — H04129 Dry eye syndrome of unspecified lacrimal gland: Secondary | ICD-10-CM | POA: Diagnosis not present

## 2012-09-21 DIAGNOSIS — H52209 Unspecified astigmatism, unspecified eye: Secondary | ICD-10-CM | POA: Diagnosis not present

## 2012-09-21 DIAGNOSIS — H40019 Open angle with borderline findings, low risk, unspecified eye: Secondary | ICD-10-CM | POA: Diagnosis not present

## 2012-10-16 ENCOUNTER — Ambulatory Visit (INDEPENDENT_AMBULATORY_CARE_PROVIDER_SITE_OTHER): Payer: Medicare Other | Admitting: Internal Medicine

## 2012-10-16 ENCOUNTER — Encounter: Payer: Self-pay | Admitting: Internal Medicine

## 2012-10-16 VITALS — BP 142/78 | HR 76 | Temp 97.7°F | Wt 125.0 lb

## 2012-10-16 DIAGNOSIS — R945 Abnormal results of liver function studies: Secondary | ICD-10-CM | POA: Diagnosis not present

## 2012-10-16 DIAGNOSIS — E039 Hypothyroidism, unspecified: Secondary | ICD-10-CM | POA: Diagnosis not present

## 2012-10-16 DIAGNOSIS — M479 Spondylosis, unspecified: Secondary | ICD-10-CM

## 2012-10-16 DIAGNOSIS — K900 Celiac disease: Secondary | ICD-10-CM | POA: Diagnosis not present

## 2012-10-16 DIAGNOSIS — M47899 Other spondylosis, site unspecified: Secondary | ICD-10-CM

## 2012-10-16 LAB — CBC WITH DIFFERENTIAL/PLATELET
Basophils Relative: 0.4 % (ref 0.0–3.0)
Eosinophils Absolute: 0.1 10*3/uL (ref 0.0–0.7)
HCT: 42.7 % (ref 36.0–46.0)
Hemoglobin: 14.4 g/dL (ref 12.0–15.0)
Lymphocytes Relative: 19.5 % (ref 12.0–46.0)
MCHC: 33.6 g/dL (ref 30.0–36.0)
Monocytes Relative: 6.8 % (ref 3.0–12.0)
Neutro Abs: 4.3 10*3/uL (ref 1.4–7.7)
RBC: 4.81 Mil/uL (ref 3.87–5.11)

## 2012-10-16 LAB — BASIC METABOLIC PANEL
CO2: 28 mEq/L (ref 19–32)
Calcium: 9.3 mg/dL (ref 8.4–10.5)
Creatinine, Ser: 0.8 mg/dL (ref 0.4–1.2)
GFR: 78.64 mL/min (ref >60.00)

## 2012-10-16 LAB — HEPATIC FUNCTION PANEL
Bilirubin, Direct: 0 mg/dL (ref 0.0–0.3)
Total Protein: 7.5 g/dL (ref 6.0–8.3)

## 2012-10-16 NOTE — Progress Notes (Signed)
  Subjective:    Patient ID: Wanda Downs, female    DOB: 1941/03/03, 72 y.o.   MRN: [id number]  HPI hla b27 arthropathy-- sees rheumatology-- unable to take medications other than 1.25 mg of prednisone  Celiac dzs-- does ok if stays on strict diet.  GERD-- taking nexium but has side effects of fatigue. When she doesn't take meds she develops a cough.  Hypothyroid- takes meds  Past Medical History  Diagnosis Date  . Hypothyroidism   . Osteoarthritis   . Celiac disease   . Fibromyalgia   . FHx: migraine headaches   . Elevated LFTs     check Hep B & C  . Depression   . Osteopenia    Past Surgical History  Procedure Date  . Abdominal hysterectomy     Bilateral S & O  . Nasal sinus surgery   . Bone spur     left knee  . Knee arthroscopy     right knee  . Bone spur     hand  . Right hand fx   . Bladder surgery   . Breast surgery     Breast implants X 2  . Cosmetic surgery     Plastic surgery (face)  . Excision morton's neuroma     reports that she has quit smoking. She does not have any smokeless tobacco history on file. Her alcohol and drug histories not on file. family history includes Arthritis in her mother and Hypertension in her mother. Allergies  Allergen Reactions  . Penicillins     REACTION: rash, swelling  . Sulfamethoxazole     REACTION: rash, swelling    Review of Systems  patient denies chest pain, shortness of breath, orthopnea. Denies lower extremity edema, abdominal pain, change in appetite, change in bowel movements. Patient denies rashes, musculoskeletal complaints. No other specific complaints in a complete review of systems.      Objective:   Physical Exam   Well-developed well-nourished female in no acute distress. HEENT exam atraumatic, normocephalic, extraocular muscles are intact. Neck is supple. No jugular venous distention no thyromegaly. Chest clear to auscultation without increased work of breathing. Cardiac exam S1 and S2 are  regular. Abdominal exam active bowel sounds, soft, nontender. Extremities no edema. Neurologic exam she is alert without any motor sensory deficits. Gait is normal. MSK-- changes of OA on hands     Assessment & Plan:

## 2012-10-17 DIAGNOSIS — D509 Iron deficiency anemia, unspecified: Secondary | ICD-10-CM | POA: Diagnosis not present

## 2012-10-17 DIAGNOSIS — E039 Hypothyroidism, unspecified: Secondary | ICD-10-CM | POA: Diagnosis not present

## 2012-10-17 DIAGNOSIS — E559 Vitamin D deficiency, unspecified: Secondary | ICD-10-CM | POA: Diagnosis not present

## 2012-10-17 DIAGNOSIS — R7989 Other specified abnormal findings of blood chemistry: Secondary | ICD-10-CM | POA: Diagnosis not present

## 2012-10-17 DIAGNOSIS — N951 Menopausal and female climacteric states: Secondary | ICD-10-CM | POA: Diagnosis not present

## 2012-10-18 NOTE — Assessment & Plan Note (Signed)
Chronic problem. She does well with her diet. She will continue to followup with gastroenterology.

## 2012-10-18 NOTE — Assessment & Plan Note (Signed)
I have asked her to discuss bilogics with her rheumatologist.

## 2012-10-18 NOTE — Assessment & Plan Note (Signed)
Lab Results  Component Value Date   TSH 1.82 10/16/2012   Well controlled. Continue current medications.

## 2012-10-19 DIAGNOSIS — L570 Actinic keratosis: Secondary | ICD-10-CM | POA: Diagnosis not present

## 2012-10-22 DIAGNOSIS — M479 Spondylosis, unspecified: Secondary | ICD-10-CM | POA: Diagnosis not present

## 2012-10-22 DIAGNOSIS — M2550 Pain in unspecified joint: Secondary | ICD-10-CM | POA: Diagnosis not present

## 2012-10-22 DIAGNOSIS — IMO0001 Reserved for inherently not codable concepts without codable children: Secondary | ICD-10-CM | POA: Diagnosis not present

## 2012-10-22 DIAGNOSIS — Z1589 Genetic susceptibility to other disease: Secondary | ICD-10-CM | POA: Diagnosis not present

## 2012-10-22 DIAGNOSIS — Z79899 Other long term (current) drug therapy: Secondary | ICD-10-CM | POA: Diagnosis not present

## 2012-10-22 DIAGNOSIS — M1990 Unspecified osteoarthritis, unspecified site: Secondary | ICD-10-CM | POA: Diagnosis not present

## 2012-10-22 DIAGNOSIS — M545 Low back pain, unspecified: Secondary | ICD-10-CM | POA: Diagnosis not present

## 2012-10-31 DIAGNOSIS — N951 Menopausal and female climacteric states: Secondary | ICD-10-CM | POA: Diagnosis not present

## 2012-11-15 DIAGNOSIS — K900 Celiac disease: Secondary | ICD-10-CM | POA: Diagnosis not present

## 2012-11-15 DIAGNOSIS — N951 Menopausal and female climacteric states: Secondary | ICD-10-CM | POA: Diagnosis not present

## 2012-11-26 DIAGNOSIS — Z85828 Personal history of other malignant neoplasm of skin: Secondary | ICD-10-CM | POA: Diagnosis not present

## 2012-11-26 DIAGNOSIS — L570 Actinic keratosis: Secondary | ICD-10-CM | POA: Diagnosis not present

## 2012-11-26 DIAGNOSIS — L738 Other specified follicular disorders: Secondary | ICD-10-CM | POA: Diagnosis not present

## 2012-11-26 DIAGNOSIS — L219 Seborrheic dermatitis, unspecified: Secondary | ICD-10-CM | POA: Diagnosis not present

## 2012-12-04 DIAGNOSIS — K900 Celiac disease: Secondary | ICD-10-CM | POA: Diagnosis not present

## 2012-12-04 DIAGNOSIS — R1084 Generalized abdominal pain: Secondary | ICD-10-CM | POA: Diagnosis not present

## 2012-12-04 DIAGNOSIS — R1310 Dysphagia, unspecified: Secondary | ICD-10-CM | POA: Diagnosis not present

## 2012-12-11 DIAGNOSIS — R05 Cough: Secondary | ICD-10-CM | POA: Diagnosis not present

## 2012-12-11 DIAGNOSIS — K5730 Diverticulosis of large intestine without perforation or abscess without bleeding: Secondary | ICD-10-CM | POA: Diagnosis not present

## 2012-12-11 DIAGNOSIS — R4789 Other speech disturbances: Secondary | ICD-10-CM | POA: Diagnosis not present

## 2012-12-11 DIAGNOSIS — R059 Cough, unspecified: Secondary | ICD-10-CM | POA: Diagnosis not present

## 2012-12-11 DIAGNOSIS — K219 Gastro-esophageal reflux disease without esophagitis: Secondary | ICD-10-CM | POA: Diagnosis not present

## 2012-12-11 DIAGNOSIS — R1310 Dysphagia, unspecified: Secondary | ICD-10-CM | POA: Diagnosis not present

## 2012-12-12 DIAGNOSIS — K900 Celiac disease: Secondary | ICD-10-CM | POA: Diagnosis not present

## 2012-12-12 DIAGNOSIS — N951 Menopausal and female climacteric states: Secondary | ICD-10-CM | POA: Diagnosis not present

## 2012-12-15 DIAGNOSIS — M129 Arthropathy, unspecified: Secondary | ICD-10-CM | POA: Diagnosis not present

## 2012-12-24 ENCOUNTER — Telehealth: Payer: Self-pay | Admitting: Internal Medicine

## 2012-12-24 DIAGNOSIS — R112 Nausea with vomiting, unspecified: Secondary | ICD-10-CM | POA: Diagnosis not present

## 2012-12-24 DIAGNOSIS — R1013 Epigastric pain: Secondary | ICD-10-CM | POA: Diagnosis not present

## 2012-12-24 DIAGNOSIS — R197 Diarrhea, unspecified: Secondary | ICD-10-CM | POA: Diagnosis not present

## 2012-12-24 NOTE — Telephone Encounter (Signed)
Patient Information:  Caller Name: Jacquline  Phone: [phone]  Patient: Wanda Downs, Wanda Downs  Gender: Female  DOB: 1941-08-07  Age: 72 Years  PCP: Birdie Sons (Adults only)  Office Follow Up:  Does the office need to follow up with this patient?: No  Instructions For The Office: N/A  RN Note:  Advised frequent sips of fluids and starchy diet after keeping fluids down for 8 hours. Advised to call back if needed.  Symptoms  Reason For Call & Symptoms: Started with vomiting and Diarrhea last night. Vomited 30 x and diarrhea atleast 10 times. Her brother is going to take her to be checked at [facility]. Triage not finished since he arrived at her house. She is feeling weak and is afraid to get up on her own.  Reviewed Health History In EMR: Yes  Reviewed Medications In EMR: Yes  Reviewed Allergies In EMR: Yes  Reviewed Surgeries / Procedures: Yes  Date of Onset of Symptoms: 12/24/2012  Treatments Tried: Phenergan Supp, Nexium  Treatments Tried Worked: No  Guideline(s) Used:  No Protocol Available - Sick Adult  Disposition Per Guideline:   Go to ED Now (or to Office with PCP Approval)  Reason For Disposition Reached:   Nursing judgment  Advice Given:  Call Back If:  New symptoms develop  You become worse.  Patient Refused Recommendation:  Patient Will Go To [facility]  Will be checked at [facility] today.

## 2012-12-25 NOTE — Telephone Encounter (Signed)
No follow up required, closing encounter. °

## 2013-01-02 DIAGNOSIS — M069 Rheumatoid arthritis, unspecified: Secondary | ICD-10-CM | POA: Diagnosis not present

## 2013-01-09 DIAGNOSIS — K900 Celiac disease: Secondary | ICD-10-CM | POA: Diagnosis not present

## 2013-01-29 DIAGNOSIS — N951 Menopausal and female climacteric states: Secondary | ICD-10-CM | POA: Diagnosis not present

## 2013-02-01 DIAGNOSIS — K900 Celiac disease: Secondary | ICD-10-CM | POA: Diagnosis not present

## 2013-02-01 DIAGNOSIS — R1084 Generalized abdominal pain: Secondary | ICD-10-CM | POA: Diagnosis not present

## 2013-02-05 DIAGNOSIS — R109 Unspecified abdominal pain: Secondary | ICD-10-CM | POA: Diagnosis not present

## 2013-02-05 DIAGNOSIS — Z90710 Acquired absence of both cervix and uterus: Secondary | ICD-10-CM | POA: Diagnosis not present

## 2013-02-12 ENCOUNTER — Other Ambulatory Visit: Payer: Self-pay | Admitting: Internal Medicine

## 2013-02-18 DIAGNOSIS — K901 Tropical sprue: Secondary | ICD-10-CM | POA: Diagnosis not present

## 2013-03-06 DIAGNOSIS — M19049 Primary osteoarthritis, unspecified hand: Secondary | ICD-10-CM | POA: Diagnosis not present

## 2013-03-06 DIAGNOSIS — M069 Rheumatoid arthritis, unspecified: Secondary | ICD-10-CM | POA: Diagnosis not present

## 2013-03-11 DIAGNOSIS — IMO0002 Reserved for concepts with insufficient information to code with codable children: Secondary | ICD-10-CM | POA: Diagnosis not present

## 2013-03-11 DIAGNOSIS — L97509 Non-pressure chronic ulcer of other part of unspecified foot with unspecified severity: Secondary | ICD-10-CM | POA: Diagnosis not present

## 2013-03-25 DIAGNOSIS — G43909 Migraine, unspecified, not intractable, without status migrainosus: Secondary | ICD-10-CM | POA: Diagnosis not present

## 2013-03-25 DIAGNOSIS — Z1331 Encounter for screening for depression: Secondary | ICD-10-CM | POA: Diagnosis not present

## 2013-03-25 DIAGNOSIS — R1110 Vomiting, unspecified: Secondary | ICD-10-CM | POA: Diagnosis not present

## 2013-03-25 DIAGNOSIS — M479 Spondylosis, unspecified: Secondary | ICD-10-CM | POA: Diagnosis not present

## 2013-03-25 DIAGNOSIS — K219 Gastro-esophageal reflux disease without esophagitis: Secondary | ICD-10-CM | POA: Diagnosis not present

## 2013-03-25 DIAGNOSIS — M1990 Unspecified osteoarthritis, unspecified site: Secondary | ICD-10-CM | POA: Diagnosis not present

## 2013-03-25 DIAGNOSIS — IMO0001 Reserved for inherently not codable concepts without codable children: Secondary | ICD-10-CM | POA: Diagnosis not present

## 2013-03-25 DIAGNOSIS — Z862 Personal history of diseases of the blood and blood-forming organs and certain disorders involving the immune mechanism: Secondary | ICD-10-CM | POA: Diagnosis not present

## 2013-03-25 DIAGNOSIS — R197 Diarrhea, unspecified: Secondary | ICD-10-CM | POA: Diagnosis not present

## 2013-04-15 ENCOUNTER — Ambulatory Visit: Payer: Medicare Other | Admitting: Internal Medicine

## 2013-04-23 DIAGNOSIS — K219 Gastro-esophageal reflux disease without esophagitis: Secondary | ICD-10-CM | POA: Diagnosis not present

## 2013-04-23 DIAGNOSIS — K900 Celiac disease: Secondary | ICD-10-CM | POA: Diagnosis not present

## 2013-04-26 DIAGNOSIS — E279 Disorder of adrenal gland, unspecified: Secondary | ICD-10-CM | POA: Diagnosis not present

## 2013-04-26 DIAGNOSIS — E559 Vitamin D deficiency, unspecified: Secondary | ICD-10-CM | POA: Diagnosis not present

## 2013-04-26 DIAGNOSIS — R5383 Other fatigue: Secondary | ICD-10-CM | POA: Diagnosis not present

## 2013-04-26 DIAGNOSIS — D518 Other vitamin B12 deficiency anemias: Secondary | ICD-10-CM | POA: Diagnosis not present

## 2013-04-26 DIAGNOSIS — E8840 Mitochondrial metabolism disorder, unspecified: Secondary | ICD-10-CM | POA: Diagnosis not present

## 2013-04-26 DIAGNOSIS — E509 Vitamin A deficiency, unspecified: Secondary | ICD-10-CM | POA: Diagnosis not present

## 2013-04-26 DIAGNOSIS — M064 Inflammatory polyarthropathy: Secondary | ICD-10-CM | POA: Diagnosis not present

## 2013-04-26 DIAGNOSIS — R5381 Other malaise: Secondary | ICD-10-CM | POA: Diagnosis not present

## 2013-04-26 DIAGNOSIS — N951 Menopausal and female climacteric states: Secondary | ICD-10-CM | POA: Diagnosis not present

## 2013-05-06 DIAGNOSIS — R109 Unspecified abdominal pain: Secondary | ICD-10-CM | POA: Diagnosis not present

## 2013-05-08 DIAGNOSIS — IMO0001 Reserved for inherently not codable concepts without codable children: Secondary | ICD-10-CM | POA: Diagnosis not present

## 2013-05-08 DIAGNOSIS — M479 Spondylosis, unspecified: Secondary | ICD-10-CM | POA: Diagnosis not present

## 2013-05-08 DIAGNOSIS — M1990 Unspecified osteoarthritis, unspecified site: Secondary | ICD-10-CM | POA: Diagnosis not present

## 2013-05-08 DIAGNOSIS — Z1589 Genetic susceptibility to other disease: Secondary | ICD-10-CM | POA: Diagnosis not present

## 2013-05-14 DIAGNOSIS — M999 Biomechanical lesion, unspecified: Secondary | ICD-10-CM | POA: Diagnosis not present

## 2013-05-14 DIAGNOSIS — M9981 Other biomechanical lesions of cervical region: Secondary | ICD-10-CM | POA: Diagnosis not present

## 2013-05-14 DIAGNOSIS — M546 Pain in thoracic spine: Secondary | ICD-10-CM | POA: Diagnosis not present

## 2013-05-14 DIAGNOSIS — M542 Cervicalgia: Secondary | ICD-10-CM | POA: Diagnosis not present

## 2013-05-17 DIAGNOSIS — M542 Cervicalgia: Secondary | ICD-10-CM | POA: Diagnosis not present

## 2013-05-17 DIAGNOSIS — M546 Pain in thoracic spine: Secondary | ICD-10-CM | POA: Diagnosis not present

## 2013-05-17 DIAGNOSIS — M9981 Other biomechanical lesions of cervical region: Secondary | ICD-10-CM | POA: Diagnosis not present

## 2013-05-17 DIAGNOSIS — M999 Biomechanical lesion, unspecified: Secondary | ICD-10-CM | POA: Diagnosis not present

## 2013-05-21 DIAGNOSIS — M999 Biomechanical lesion, unspecified: Secondary | ICD-10-CM | POA: Diagnosis not present

## 2013-05-21 DIAGNOSIS — M9981 Other biomechanical lesions of cervical region: Secondary | ICD-10-CM | POA: Diagnosis not present

## 2013-05-21 DIAGNOSIS — M546 Pain in thoracic spine: Secondary | ICD-10-CM | POA: Diagnosis not present

## 2013-05-21 DIAGNOSIS — M542 Cervicalgia: Secondary | ICD-10-CM | POA: Diagnosis not present

## 2013-05-22 DIAGNOSIS — N951 Menopausal and female climacteric states: Secondary | ICD-10-CM | POA: Diagnosis not present

## 2013-05-23 DIAGNOSIS — K5289 Other specified noninfective gastroenteritis and colitis: Secondary | ICD-10-CM | POA: Diagnosis not present

## 2013-05-23 DIAGNOSIS — K2900 Acute gastritis without bleeding: Secondary | ICD-10-CM | POA: Diagnosis not present

## 2013-05-27 DIAGNOSIS — E785 Hyperlipidemia, unspecified: Secondary | ICD-10-CM | POA: Diagnosis not present

## 2013-05-27 DIAGNOSIS — IMO0001 Reserved for inherently not codable concepts without codable children: Secondary | ICD-10-CM | POA: Diagnosis not present

## 2013-05-27 DIAGNOSIS — K900 Celiac disease: Secondary | ICD-10-CM | POA: Diagnosis not present

## 2013-05-28 DIAGNOSIS — Z85828 Personal history of other malignant neoplasm of skin: Secondary | ICD-10-CM | POA: Diagnosis not present

## 2013-05-28 DIAGNOSIS — L738 Other specified follicular disorders: Secondary | ICD-10-CM | POA: Diagnosis not present

## 2013-05-28 DIAGNOSIS — L570 Actinic keratosis: Secondary | ICD-10-CM | POA: Diagnosis not present

## 2013-05-29 DIAGNOSIS — M999 Biomechanical lesion, unspecified: Secondary | ICD-10-CM | POA: Diagnosis not present

## 2013-05-29 DIAGNOSIS — M546 Pain in thoracic spine: Secondary | ICD-10-CM | POA: Diagnosis not present

## 2013-05-29 DIAGNOSIS — M542 Cervicalgia: Secondary | ICD-10-CM | POA: Diagnosis not present

## 2013-05-29 DIAGNOSIS — M9981 Other biomechanical lesions of cervical region: Secondary | ICD-10-CM | POA: Diagnosis not present

## 2013-06-03 DIAGNOSIS — R197 Diarrhea, unspecified: Secondary | ICD-10-CM | POA: Diagnosis not present

## 2013-06-03 DIAGNOSIS — Z0000 Encounter for general adult medical examination without abnormal findings: Secondary | ICD-10-CM | POA: Diagnosis not present

## 2013-06-03 DIAGNOSIS — IMO0001 Reserved for inherently not codable concepts without codable children: Secondary | ICD-10-CM | POA: Diagnosis not present

## 2013-06-03 DIAGNOSIS — R1312 Dysphagia, oropharyngeal phase: Secondary | ICD-10-CM | POA: Diagnosis not present

## 2013-06-03 DIAGNOSIS — E785 Hyperlipidemia, unspecified: Secondary | ICD-10-CM | POA: Diagnosis not present

## 2013-06-03 DIAGNOSIS — R1084 Generalized abdominal pain: Secondary | ICD-10-CM | POA: Diagnosis not present

## 2013-06-03 DIAGNOSIS — IMO0002 Reserved for concepts with insufficient information to code with codable children: Secondary | ICD-10-CM | POA: Diagnosis not present

## 2013-06-05 DIAGNOSIS — M9981 Other biomechanical lesions of cervical region: Secondary | ICD-10-CM | POA: Diagnosis not present

## 2013-06-05 DIAGNOSIS — M542 Cervicalgia: Secondary | ICD-10-CM | POA: Diagnosis not present

## 2013-06-05 DIAGNOSIS — M546 Pain in thoracic spine: Secondary | ICD-10-CM | POA: Diagnosis not present

## 2013-06-05 DIAGNOSIS — M999 Biomechanical lesion, unspecified: Secondary | ICD-10-CM | POA: Diagnosis not present

## 2013-06-06 DIAGNOSIS — L97509 Non-pressure chronic ulcer of other part of unspecified foot with unspecified severity: Secondary | ICD-10-CM | POA: Diagnosis not present

## 2013-06-12 DIAGNOSIS — M546 Pain in thoracic spine: Secondary | ICD-10-CM | POA: Diagnosis not present

## 2013-06-12 DIAGNOSIS — M542 Cervicalgia: Secondary | ICD-10-CM | POA: Diagnosis not present

## 2013-06-12 DIAGNOSIS — M999 Biomechanical lesion, unspecified: Secondary | ICD-10-CM | POA: Diagnosis not present

## 2013-06-12 DIAGNOSIS — M9981 Other biomechanical lesions of cervical region: Secondary | ICD-10-CM | POA: Diagnosis not present

## 2013-06-19 DIAGNOSIS — M542 Cervicalgia: Secondary | ICD-10-CM | POA: Diagnosis not present

## 2013-06-19 DIAGNOSIS — M546 Pain in thoracic spine: Secondary | ICD-10-CM | POA: Diagnosis not present

## 2013-06-19 DIAGNOSIS — M999 Biomechanical lesion, unspecified: Secondary | ICD-10-CM | POA: Diagnosis not present

## 2013-06-19 DIAGNOSIS — M9981 Other biomechanical lesions of cervical region: Secondary | ICD-10-CM | POA: Diagnosis not present

## 2013-06-26 DIAGNOSIS — M999 Biomechanical lesion, unspecified: Secondary | ICD-10-CM | POA: Diagnosis not present

## 2013-06-26 DIAGNOSIS — M542 Cervicalgia: Secondary | ICD-10-CM | POA: Diagnosis not present

## 2013-06-26 DIAGNOSIS — M9981 Other biomechanical lesions of cervical region: Secondary | ICD-10-CM | POA: Diagnosis not present

## 2013-06-26 DIAGNOSIS — M546 Pain in thoracic spine: Secondary | ICD-10-CM | POA: Diagnosis not present

## 2013-07-03 DIAGNOSIS — M999 Biomechanical lesion, unspecified: Secondary | ICD-10-CM | POA: Diagnosis not present

## 2013-07-03 DIAGNOSIS — M9981 Other biomechanical lesions of cervical region: Secondary | ICD-10-CM | POA: Diagnosis not present

## 2013-07-03 DIAGNOSIS — M542 Cervicalgia: Secondary | ICD-10-CM | POA: Diagnosis not present

## 2013-07-03 DIAGNOSIS — M546 Pain in thoracic spine: Secondary | ICD-10-CM | POA: Diagnosis not present

## 2013-07-10 DIAGNOSIS — M999 Biomechanical lesion, unspecified: Secondary | ICD-10-CM | POA: Diagnosis not present

## 2013-07-10 DIAGNOSIS — M542 Cervicalgia: Secondary | ICD-10-CM | POA: Diagnosis not present

## 2013-07-10 DIAGNOSIS — M9981 Other biomechanical lesions of cervical region: Secondary | ICD-10-CM | POA: Diagnosis not present

## 2013-07-10 DIAGNOSIS — M546 Pain in thoracic spine: Secondary | ICD-10-CM | POA: Diagnosis not present

## 2013-08-23 DIAGNOSIS — IMO0002 Reserved for concepts with insufficient information to code with codable children: Secondary | ICD-10-CM | POA: Diagnosis not present

## 2013-08-23 DIAGNOSIS — IMO0001 Reserved for inherently not codable concepts without codable children: Secondary | ICD-10-CM | POA: Diagnosis not present

## 2013-08-23 DIAGNOSIS — R1013 Epigastric pain: Secondary | ICD-10-CM | POA: Diagnosis not present

## 2013-08-23 DIAGNOSIS — Z23 Encounter for immunization: Secondary | ICD-10-CM | POA: Diagnosis not present

## 2013-09-04 DIAGNOSIS — E279 Disorder of adrenal gland, unspecified: Secondary | ICD-10-CM | POA: Diagnosis not present

## 2013-09-04 DIAGNOSIS — M064 Inflammatory polyarthropathy: Secondary | ICD-10-CM | POA: Diagnosis not present

## 2013-10-16 DIAGNOSIS — E039 Hypothyroidism, unspecified: Secondary | ICD-10-CM | POA: Diagnosis not present

## 2013-10-16 DIAGNOSIS — E559 Vitamin D deficiency, unspecified: Secondary | ICD-10-CM | POA: Diagnosis not present

## 2013-10-16 DIAGNOSIS — R7989 Other specified abnormal findings of blood chemistry: Secondary | ICD-10-CM | POA: Diagnosis not present

## 2013-10-16 DIAGNOSIS — D509 Iron deficiency anemia, unspecified: Secondary | ICD-10-CM | POA: Diagnosis not present

## 2013-10-17 DIAGNOSIS — H04129 Dry eye syndrome of unspecified lacrimal gland: Secondary | ICD-10-CM | POA: Diagnosis not present

## 2013-10-17 DIAGNOSIS — H02059 Trichiasis without entropian unspecified eye, unspecified eyelid: Secondary | ICD-10-CM | POA: Diagnosis not present

## 2013-10-17 DIAGNOSIS — H40019 Open angle with borderline findings, low risk, unspecified eye: Secondary | ICD-10-CM | POA: Diagnosis not present

## 2013-10-17 DIAGNOSIS — H01009 Unspecified blepharitis unspecified eye, unspecified eyelid: Secondary | ICD-10-CM | POA: Diagnosis not present

## 2013-10-29 DIAGNOSIS — N951 Menopausal and female climacteric states: Secondary | ICD-10-CM | POA: Diagnosis not present

## 2013-11-07 DIAGNOSIS — M479 Spondylosis, unspecified: Secondary | ICD-10-CM | POA: Diagnosis not present

## 2013-11-07 DIAGNOSIS — M25849 Other specified joint disorders, unspecified hand: Secondary | ICD-10-CM | POA: Diagnosis not present

## 2013-11-07 DIAGNOSIS — M1990 Unspecified osteoarthritis, unspecified site: Secondary | ICD-10-CM | POA: Diagnosis not present

## 2013-11-07 DIAGNOSIS — Z1589 Genetic susceptibility to other disease: Secondary | ICD-10-CM | POA: Diagnosis not present

## 2013-11-07 DIAGNOSIS — M2550 Pain in unspecified joint: Secondary | ICD-10-CM | POA: Diagnosis not present

## 2013-12-17 DIAGNOSIS — M545 Low back pain, unspecified: Secondary | ICD-10-CM | POA: Diagnosis not present

## 2013-12-17 DIAGNOSIS — M4716 Other spondylosis with myelopathy, lumbar region: Secondary | ICD-10-CM | POA: Diagnosis not present

## 2013-12-17 DIAGNOSIS — M999 Biomechanical lesion, unspecified: Secondary | ICD-10-CM | POA: Diagnosis not present

## 2013-12-17 DIAGNOSIS — M5137 Other intervertebral disc degeneration, lumbosacral region: Secondary | ICD-10-CM | POA: Diagnosis not present

## 2013-12-18 DIAGNOSIS — M4716 Other spondylosis with myelopathy, lumbar region: Secondary | ICD-10-CM | POA: Diagnosis not present

## 2013-12-18 DIAGNOSIS — M545 Low back pain, unspecified: Secondary | ICD-10-CM | POA: Diagnosis not present

## 2013-12-18 DIAGNOSIS — M999 Biomechanical lesion, unspecified: Secondary | ICD-10-CM | POA: Diagnosis not present

## 2013-12-18 DIAGNOSIS — M5137 Other intervertebral disc degeneration, lumbosacral region: Secondary | ICD-10-CM | POA: Diagnosis not present

## 2013-12-19 DIAGNOSIS — M479 Spondylosis, unspecified: Secondary | ICD-10-CM | POA: Diagnosis not present

## 2013-12-19 DIAGNOSIS — M2550 Pain in unspecified joint: Secondary | ICD-10-CM | POA: Diagnosis not present

## 2013-12-19 DIAGNOSIS — Z1589 Genetic susceptibility to other disease: Secondary | ICD-10-CM | POA: Diagnosis not present

## 2013-12-19 DIAGNOSIS — M1990 Unspecified osteoarthritis, unspecified site: Secondary | ICD-10-CM | POA: Diagnosis not present

## 2013-12-23 DIAGNOSIS — M5137 Other intervertebral disc degeneration, lumbosacral region: Secondary | ICD-10-CM | POA: Diagnosis not present

## 2013-12-23 DIAGNOSIS — M999 Biomechanical lesion, unspecified: Secondary | ICD-10-CM | POA: Diagnosis not present

## 2013-12-23 DIAGNOSIS — M545 Low back pain, unspecified: Secondary | ICD-10-CM | POA: Diagnosis not present

## 2013-12-23 DIAGNOSIS — M4716 Other spondylosis with myelopathy, lumbar region: Secondary | ICD-10-CM | POA: Diagnosis not present

## 2013-12-25 DIAGNOSIS — M5137 Other intervertebral disc degeneration, lumbosacral region: Secondary | ICD-10-CM | POA: Diagnosis not present

## 2013-12-25 DIAGNOSIS — M545 Low back pain, unspecified: Secondary | ICD-10-CM | POA: Diagnosis not present

## 2013-12-25 DIAGNOSIS — M4716 Other spondylosis with myelopathy, lumbar region: Secondary | ICD-10-CM | POA: Diagnosis not present

## 2013-12-25 DIAGNOSIS — M999 Biomechanical lesion, unspecified: Secondary | ICD-10-CM | POA: Diagnosis not present

## 2013-12-30 DIAGNOSIS — M545 Low back pain, unspecified: Secondary | ICD-10-CM | POA: Diagnosis not present

## 2013-12-30 DIAGNOSIS — M4716 Other spondylosis with myelopathy, lumbar region: Secondary | ICD-10-CM | POA: Diagnosis not present

## 2013-12-30 DIAGNOSIS — M5137 Other intervertebral disc degeneration, lumbosacral region: Secondary | ICD-10-CM | POA: Diagnosis not present

## 2013-12-30 DIAGNOSIS — M999 Biomechanical lesion, unspecified: Secondary | ICD-10-CM | POA: Diagnosis not present

## 2014-01-01 DIAGNOSIS — M545 Low back pain, unspecified: Secondary | ICD-10-CM | POA: Diagnosis not present

## 2014-01-01 DIAGNOSIS — M4716 Other spondylosis with myelopathy, lumbar region: Secondary | ICD-10-CM | POA: Diagnosis not present

## 2014-01-01 DIAGNOSIS — M999 Biomechanical lesion, unspecified: Secondary | ICD-10-CM | POA: Diagnosis not present

## 2014-01-01 DIAGNOSIS — M5137 Other intervertebral disc degeneration, lumbosacral region: Secondary | ICD-10-CM | POA: Diagnosis not present

## 2014-01-13 DIAGNOSIS — M545 Low back pain, unspecified: Secondary | ICD-10-CM | POA: Diagnosis not present

## 2014-01-13 DIAGNOSIS — M999 Biomechanical lesion, unspecified: Secondary | ICD-10-CM | POA: Diagnosis not present

## 2014-01-13 DIAGNOSIS — M4716 Other spondylosis with myelopathy, lumbar region: Secondary | ICD-10-CM | POA: Diagnosis not present

## 2014-01-13 DIAGNOSIS — M5137 Other intervertebral disc degeneration, lumbosacral region: Secondary | ICD-10-CM | POA: Diagnosis not present

## 2014-01-15 DIAGNOSIS — M4716 Other spondylosis with myelopathy, lumbar region: Secondary | ICD-10-CM | POA: Diagnosis not present

## 2014-01-15 DIAGNOSIS — M545 Low back pain, unspecified: Secondary | ICD-10-CM | POA: Diagnosis not present

## 2014-01-15 DIAGNOSIS — M5137 Other intervertebral disc degeneration, lumbosacral region: Secondary | ICD-10-CM | POA: Diagnosis not present

## 2014-01-15 DIAGNOSIS — M999 Biomechanical lesion, unspecified: Secondary | ICD-10-CM | POA: Diagnosis not present

## 2014-01-22 DIAGNOSIS — M5137 Other intervertebral disc degeneration, lumbosacral region: Secondary | ICD-10-CM | POA: Diagnosis not present

## 2014-01-22 DIAGNOSIS — M545 Low back pain, unspecified: Secondary | ICD-10-CM | POA: Diagnosis not present

## 2014-01-22 DIAGNOSIS — M4716 Other spondylosis with myelopathy, lumbar region: Secondary | ICD-10-CM | POA: Diagnosis not present

## 2014-01-22 DIAGNOSIS — M999 Biomechanical lesion, unspecified: Secondary | ICD-10-CM | POA: Diagnosis not present

## 2014-01-23 DIAGNOSIS — Z1331 Encounter for screening for depression: Secondary | ICD-10-CM | POA: Diagnosis not present

## 2014-01-23 DIAGNOSIS — E785 Hyperlipidemia, unspecified: Secondary | ICD-10-CM | POA: Diagnosis not present

## 2014-01-24 DIAGNOSIS — H01009 Unspecified blepharitis unspecified eye, unspecified eyelid: Secondary | ICD-10-CM | POA: Diagnosis not present

## 2014-01-24 DIAGNOSIS — H04129 Dry eye syndrome of unspecified lacrimal gland: Secondary | ICD-10-CM | POA: Diagnosis not present

## 2014-01-27 DIAGNOSIS — N951 Menopausal and female climacteric states: Secondary | ICD-10-CM | POA: Diagnosis not present

## 2014-01-28 DIAGNOSIS — N951 Menopausal and female climacteric states: Secondary | ICD-10-CM | POA: Diagnosis not present

## 2014-01-30 DIAGNOSIS — M999 Biomechanical lesion, unspecified: Secondary | ICD-10-CM | POA: Diagnosis not present

## 2014-01-30 DIAGNOSIS — M545 Low back pain, unspecified: Secondary | ICD-10-CM | POA: Diagnosis not present

## 2014-01-30 DIAGNOSIS — M5137 Other intervertebral disc degeneration, lumbosacral region: Secondary | ICD-10-CM | POA: Diagnosis not present

## 2014-01-30 DIAGNOSIS — M4716 Other spondylosis with myelopathy, lumbar region: Secondary | ICD-10-CM | POA: Diagnosis not present

## 2014-02-06 DIAGNOSIS — M4716 Other spondylosis with myelopathy, lumbar region: Secondary | ICD-10-CM | POA: Diagnosis not present

## 2014-02-06 DIAGNOSIS — M5137 Other intervertebral disc degeneration, lumbosacral region: Secondary | ICD-10-CM | POA: Diagnosis not present

## 2014-02-06 DIAGNOSIS — M545 Low back pain, unspecified: Secondary | ICD-10-CM | POA: Diagnosis not present

## 2014-02-06 DIAGNOSIS — M999 Biomechanical lesion, unspecified: Secondary | ICD-10-CM | POA: Diagnosis not present

## 2014-02-11 DIAGNOSIS — M545 Low back pain, unspecified: Secondary | ICD-10-CM | POA: Diagnosis not present

## 2014-02-11 DIAGNOSIS — M5137 Other intervertebral disc degeneration, lumbosacral region: Secondary | ICD-10-CM | POA: Diagnosis not present

## 2014-02-11 DIAGNOSIS — M999 Biomechanical lesion, unspecified: Secondary | ICD-10-CM | POA: Diagnosis not present

## 2014-02-11 DIAGNOSIS — M4716 Other spondylosis with myelopathy, lumbar region: Secondary | ICD-10-CM | POA: Diagnosis not present

## 2014-02-17 DIAGNOSIS — IMO0002 Reserved for concepts with insufficient information to code with codable children: Secondary | ICD-10-CM | POA: Diagnosis not present

## 2014-02-17 DIAGNOSIS — M4716 Other spondylosis with myelopathy, lumbar region: Secondary | ICD-10-CM | POA: Diagnosis not present

## 2014-02-17 DIAGNOSIS — M999 Biomechanical lesion, unspecified: Secondary | ICD-10-CM | POA: Diagnosis not present

## 2014-02-17 DIAGNOSIS — M5137 Other intervertebral disc degeneration, lumbosacral region: Secondary | ICD-10-CM | POA: Diagnosis not present

## 2014-02-17 DIAGNOSIS — L97509 Non-pressure chronic ulcer of other part of unspecified foot with unspecified severity: Secondary | ICD-10-CM | POA: Diagnosis not present

## 2014-02-17 DIAGNOSIS — M545 Low back pain, unspecified: Secondary | ICD-10-CM | POA: Diagnosis not present

## 2014-02-19 DIAGNOSIS — M545 Low back pain, unspecified: Secondary | ICD-10-CM | POA: Diagnosis not present

## 2014-02-19 DIAGNOSIS — M5137 Other intervertebral disc degeneration, lumbosacral region: Secondary | ICD-10-CM | POA: Diagnosis not present

## 2014-02-19 DIAGNOSIS — M4716 Other spondylosis with myelopathy, lumbar region: Secondary | ICD-10-CM | POA: Diagnosis not present

## 2014-02-19 DIAGNOSIS — M999 Biomechanical lesion, unspecified: Secondary | ICD-10-CM | POA: Diagnosis not present

## 2014-02-24 DIAGNOSIS — M545 Low back pain, unspecified: Secondary | ICD-10-CM | POA: Diagnosis not present

## 2014-02-24 DIAGNOSIS — M5137 Other intervertebral disc degeneration, lumbosacral region: Secondary | ICD-10-CM | POA: Diagnosis not present

## 2014-02-24 DIAGNOSIS — M4716 Other spondylosis with myelopathy, lumbar region: Secondary | ICD-10-CM | POA: Diagnosis not present

## 2014-02-24 DIAGNOSIS — M999 Biomechanical lesion, unspecified: Secondary | ICD-10-CM | POA: Diagnosis not present

## 2014-02-28 DIAGNOSIS — E639 Nutritional deficiency, unspecified: Secondary | ICD-10-CM | POA: Diagnosis not present

## 2014-02-28 DIAGNOSIS — E7210 Disorders of sulfur-bearing amino-acid metabolism, unspecified: Secondary | ICD-10-CM | POA: Diagnosis not present

## 2014-02-28 DIAGNOSIS — E279 Disorder of adrenal gland, unspecified: Secondary | ICD-10-CM | POA: Diagnosis not present

## 2014-02-28 DIAGNOSIS — E7800 Pure hypercholesterolemia, unspecified: Secondary | ICD-10-CM | POA: Diagnosis not present

## 2014-03-17 DIAGNOSIS — M545 Low back pain, unspecified: Secondary | ICD-10-CM | POA: Diagnosis not present

## 2014-03-17 DIAGNOSIS — M4716 Other spondylosis with myelopathy, lumbar region: Secondary | ICD-10-CM | POA: Diagnosis not present

## 2014-03-17 DIAGNOSIS — M5137 Other intervertebral disc degeneration, lumbosacral region: Secondary | ICD-10-CM | POA: Diagnosis not present

## 2014-03-17 DIAGNOSIS — M999 Biomechanical lesion, unspecified: Secondary | ICD-10-CM | POA: Diagnosis not present

## 2014-03-24 DIAGNOSIS — M4716 Other spondylosis with myelopathy, lumbar region: Secondary | ICD-10-CM | POA: Diagnosis not present

## 2014-03-24 DIAGNOSIS — M545 Low back pain, unspecified: Secondary | ICD-10-CM | POA: Diagnosis not present

## 2014-03-24 DIAGNOSIS — M999 Biomechanical lesion, unspecified: Secondary | ICD-10-CM | POA: Diagnosis not present

## 2014-03-24 DIAGNOSIS — M5137 Other intervertebral disc degeneration, lumbosacral region: Secondary | ICD-10-CM | POA: Diagnosis not present

## 2014-03-31 DIAGNOSIS — M5137 Other intervertebral disc degeneration, lumbosacral region: Secondary | ICD-10-CM | POA: Diagnosis not present

## 2014-03-31 DIAGNOSIS — M545 Low back pain, unspecified: Secondary | ICD-10-CM | POA: Diagnosis not present

## 2014-03-31 DIAGNOSIS — M999 Biomechanical lesion, unspecified: Secondary | ICD-10-CM | POA: Diagnosis not present

## 2014-03-31 DIAGNOSIS — M4716 Other spondylosis with myelopathy, lumbar region: Secondary | ICD-10-CM | POA: Diagnosis not present

## 2014-04-07 DIAGNOSIS — M999 Biomechanical lesion, unspecified: Secondary | ICD-10-CM | POA: Diagnosis not present

## 2014-04-07 DIAGNOSIS — M545 Low back pain, unspecified: Secondary | ICD-10-CM | POA: Diagnosis not present

## 2014-04-07 DIAGNOSIS — M4716 Other spondylosis with myelopathy, lumbar region: Secondary | ICD-10-CM | POA: Diagnosis not present

## 2014-04-07 DIAGNOSIS — M5137 Other intervertebral disc degeneration, lumbosacral region: Secondary | ICD-10-CM | POA: Diagnosis not present

## 2014-04-14 DIAGNOSIS — M545 Low back pain, unspecified: Secondary | ICD-10-CM | POA: Diagnosis not present

## 2014-04-14 DIAGNOSIS — M999 Biomechanical lesion, unspecified: Secondary | ICD-10-CM | POA: Diagnosis not present

## 2014-04-14 DIAGNOSIS — M5137 Other intervertebral disc degeneration, lumbosacral region: Secondary | ICD-10-CM | POA: Diagnosis not present

## 2014-04-14 DIAGNOSIS — M4716 Other spondylosis with myelopathy, lumbar region: Secondary | ICD-10-CM | POA: Diagnosis not present

## 2014-04-30 ENCOUNTER — Other Ambulatory Visit: Payer: Self-pay

## 2014-04-30 DIAGNOSIS — Z1231 Encounter for screening mammogram for malignant neoplasm of breast: Secondary | ICD-10-CM

## 2014-05-07 ENCOUNTER — Ambulatory Visit
Admission: RE | Admit: 2014-05-07 | Discharge: 2014-05-07 | Disposition: A | Discharge status: Home / Self Care | Payer: Medicare Other | Source: Ambulatory Visit

## 2014-05-07 ENCOUNTER — Other Ambulatory Visit: Payer: Self-pay

## 2014-05-07 ENCOUNTER — Encounter (INDEPENDENT_AMBULATORY_CARE_PROVIDER_SITE_OTHER): Payer: Self-pay

## 2014-05-07 DIAGNOSIS — M479 Spondylosis, unspecified: Secondary | ICD-10-CM | POA: Diagnosis not present

## 2014-05-07 DIAGNOSIS — M2550 Pain in unspecified joint: Secondary | ICD-10-CM | POA: Diagnosis not present

## 2014-05-07 DIAGNOSIS — Z1589 Genetic susceptibility to other disease: Secondary | ICD-10-CM | POA: Diagnosis not present

## 2014-05-07 DIAGNOSIS — Z1231 Encounter for screening mammogram for malignant neoplasm of breast: Secondary | ICD-10-CM

## 2014-05-07 DIAGNOSIS — M1990 Unspecified osteoarthritis, unspecified site: Secondary | ICD-10-CM | POA: Diagnosis not present

## 2014-05-08 ENCOUNTER — Other Ambulatory Visit: Payer: Self-pay

## 2014-05-08 DIAGNOSIS — Z85828 Personal history of other malignant neoplasm of skin: Secondary | ICD-10-CM | POA: Diagnosis not present

## 2014-05-08 DIAGNOSIS — L570 Actinic keratosis: Secondary | ICD-10-CM | POA: Diagnosis not present

## 2014-05-08 DIAGNOSIS — D0420 Carcinoma in situ of skin of unspecified ear and external auricular canal: Secondary | ICD-10-CM | POA: Diagnosis not present

## 2014-05-08 DIAGNOSIS — L821 Other seborrheic keratosis: Secondary | ICD-10-CM | POA: Diagnosis not present

## 2014-05-08 DIAGNOSIS — L819 Disorder of pigmentation, unspecified: Secondary | ICD-10-CM | POA: Diagnosis not present

## 2014-05-08 DIAGNOSIS — D485 Neoplasm of uncertain behavior of skin: Secondary | ICD-10-CM | POA: Diagnosis not present

## 2014-05-15 DIAGNOSIS — D0420 Carcinoma in situ of skin of unspecified ear and external auricular canal: Secondary | ICD-10-CM | POA: Diagnosis not present

## 2014-05-15 DIAGNOSIS — Z85828 Personal history of other malignant neoplasm of skin: Secondary | ICD-10-CM | POA: Diagnosis not present

## 2014-05-15 DIAGNOSIS — L570 Actinic keratosis: Secondary | ICD-10-CM | POA: Diagnosis not present

## 2014-05-28 DIAGNOSIS — E039 Hypothyroidism, unspecified: Secondary | ICD-10-CM | POA: Diagnosis not present

## 2014-05-28 DIAGNOSIS — R7989 Other specified abnormal findings of blood chemistry: Secondary | ICD-10-CM | POA: Diagnosis not present

## 2014-05-28 DIAGNOSIS — E559 Vitamin D deficiency, unspecified: Secondary | ICD-10-CM | POA: Diagnosis not present

## 2014-05-30 DIAGNOSIS — Z0000 Encounter for general adult medical examination without abnormal findings: Secondary | ICD-10-CM | POA: Diagnosis not present

## 2014-05-30 DIAGNOSIS — E785 Hyperlipidemia, unspecified: Secondary | ICD-10-CM | POA: Diagnosis not present

## 2014-06-02 DIAGNOSIS — C44221 Squamous cell carcinoma of skin of unspecified ear and external auricular canal: Secondary | ICD-10-CM | POA: Diagnosis not present

## 2014-06-02 DIAGNOSIS — Z85828 Personal history of other malignant neoplasm of skin: Secondary | ICD-10-CM | POA: Diagnosis not present

## 2014-06-06 DIAGNOSIS — K900 Celiac disease: Secondary | ICD-10-CM | POA: Diagnosis not present

## 2014-06-06 DIAGNOSIS — Z1212 Encounter for screening for malignant neoplasm of rectum: Secondary | ICD-10-CM | POA: Diagnosis not present

## 2014-06-06 DIAGNOSIS — Z859 Personal history of malignant neoplasm, unspecified: Secondary | ICD-10-CM | POA: Diagnosis not present

## 2014-06-06 DIAGNOSIS — R197 Diarrhea, unspecified: Secondary | ICD-10-CM | POA: Diagnosis not present

## 2014-06-06 DIAGNOSIS — K219 Gastro-esophageal reflux disease without esophagitis: Secondary | ICD-10-CM | POA: Diagnosis not present

## 2014-06-06 DIAGNOSIS — E785 Hyperlipidemia, unspecified: Secondary | ICD-10-CM | POA: Diagnosis not present

## 2014-06-06 DIAGNOSIS — IMO0001 Reserved for inherently not codable concepts without codable children: Secondary | ICD-10-CM | POA: Diagnosis not present

## 2014-06-06 DIAGNOSIS — Z1331 Encounter for screening for depression: Secondary | ICD-10-CM | POA: Diagnosis not present

## 2014-06-06 DIAGNOSIS — Z862 Personal history of diseases of the blood and blood-forming organs and certain disorders involving the immune mechanism: Secondary | ICD-10-CM | POA: Diagnosis not present

## 2014-06-06 DIAGNOSIS — Z0000 Encounter for general adult medical examination without abnormal findings: Secondary | ICD-10-CM | POA: Diagnosis not present

## 2014-06-11 DIAGNOSIS — N951 Menopausal and female climacteric states: Secondary | ICD-10-CM | POA: Diagnosis not present

## 2014-06-12 DIAGNOSIS — E279 Disorder of adrenal gland, unspecified: Secondary | ICD-10-CM | POA: Diagnosis not present

## 2014-06-12 DIAGNOSIS — D518 Other vitamin B12 deficiency anemias: Secondary | ICD-10-CM | POA: Diagnosis not present

## 2014-06-12 DIAGNOSIS — K900 Celiac disease: Secondary | ICD-10-CM | POA: Diagnosis not present

## 2014-06-12 DIAGNOSIS — E559 Vitamin D deficiency, unspecified: Secondary | ICD-10-CM | POA: Diagnosis not present

## 2014-06-12 DIAGNOSIS — E509 Vitamin A deficiency, unspecified: Secondary | ICD-10-CM | POA: Diagnosis not present

## 2014-06-12 DIAGNOSIS — N951 Menopausal and female climacteric states: Secondary | ICD-10-CM | POA: Diagnosis not present

## 2014-06-18 DIAGNOSIS — M5137 Other intervertebral disc degeneration, lumbosacral region: Secondary | ICD-10-CM | POA: Diagnosis not present

## 2014-06-18 DIAGNOSIS — M545 Low back pain, unspecified: Secondary | ICD-10-CM | POA: Diagnosis not present

## 2014-06-18 DIAGNOSIS — M4716 Other spondylosis with myelopathy, lumbar region: Secondary | ICD-10-CM | POA: Diagnosis not present

## 2014-06-18 DIAGNOSIS — M999 Biomechanical lesion, unspecified: Secondary | ICD-10-CM | POA: Diagnosis not present

## 2014-06-23 DIAGNOSIS — M4716 Other spondylosis with myelopathy, lumbar region: Secondary | ICD-10-CM | POA: Diagnosis not present

## 2014-06-23 DIAGNOSIS — M545 Low back pain, unspecified: Secondary | ICD-10-CM | POA: Diagnosis not present

## 2014-06-23 DIAGNOSIS — M5137 Other intervertebral disc degeneration, lumbosacral region: Secondary | ICD-10-CM | POA: Diagnosis not present

## 2014-06-23 DIAGNOSIS — M999 Biomechanical lesion, unspecified: Secondary | ICD-10-CM | POA: Diagnosis not present

## 2014-06-26 DIAGNOSIS — M545 Low back pain, unspecified: Secondary | ICD-10-CM | POA: Diagnosis not present

## 2014-06-26 DIAGNOSIS — M999 Biomechanical lesion, unspecified: Secondary | ICD-10-CM | POA: Diagnosis not present

## 2014-06-26 DIAGNOSIS — M4716 Other spondylosis with myelopathy, lumbar region: Secondary | ICD-10-CM | POA: Diagnosis not present

## 2014-06-26 DIAGNOSIS — M5137 Other intervertebral disc degeneration, lumbosacral region: Secondary | ICD-10-CM | POA: Diagnosis not present

## 2014-06-30 DIAGNOSIS — M4716 Other spondylosis with myelopathy, lumbar region: Secondary | ICD-10-CM | POA: Diagnosis not present

## 2014-06-30 DIAGNOSIS — M999 Biomechanical lesion, unspecified: Secondary | ICD-10-CM | POA: Diagnosis not present

## 2014-06-30 DIAGNOSIS — M545 Low back pain, unspecified: Secondary | ICD-10-CM | POA: Diagnosis not present

## 2014-06-30 DIAGNOSIS — M5137 Other intervertebral disc degeneration, lumbosacral region: Secondary | ICD-10-CM | POA: Diagnosis not present

## 2014-07-03 DIAGNOSIS — K900 Celiac disease: Secondary | ICD-10-CM | POA: Diagnosis not present

## 2014-07-03 DIAGNOSIS — R197 Diarrhea, unspecified: Secondary | ICD-10-CM | POA: Diagnosis not present

## 2014-07-03 DIAGNOSIS — R110 Nausea: Secondary | ICD-10-CM | POA: Diagnosis not present

## 2014-07-03 DIAGNOSIS — R1013 Epigastric pain: Secondary | ICD-10-CM | POA: Diagnosis not present

## 2014-07-07 DIAGNOSIS — M4716 Other spondylosis with myelopathy, lumbar region: Secondary | ICD-10-CM | POA: Diagnosis not present

## 2014-07-07 DIAGNOSIS — M5137 Other intervertebral disc degeneration, lumbosacral region: Secondary | ICD-10-CM | POA: Diagnosis not present

## 2014-07-07 DIAGNOSIS — M545 Low back pain, unspecified: Secondary | ICD-10-CM | POA: Diagnosis not present

## 2014-07-07 DIAGNOSIS — M999 Biomechanical lesion, unspecified: Secondary | ICD-10-CM | POA: Diagnosis not present

## 2014-07-18 IMAGING — CR DG CHEST 2V
2 series · 2 of 2 positions shown · non-contrast
Comparison: 01/30/2006

CLINICAL DATA: Chronic cough

CHEST - 2 VIEW

[view not recorded (1 of 2)]
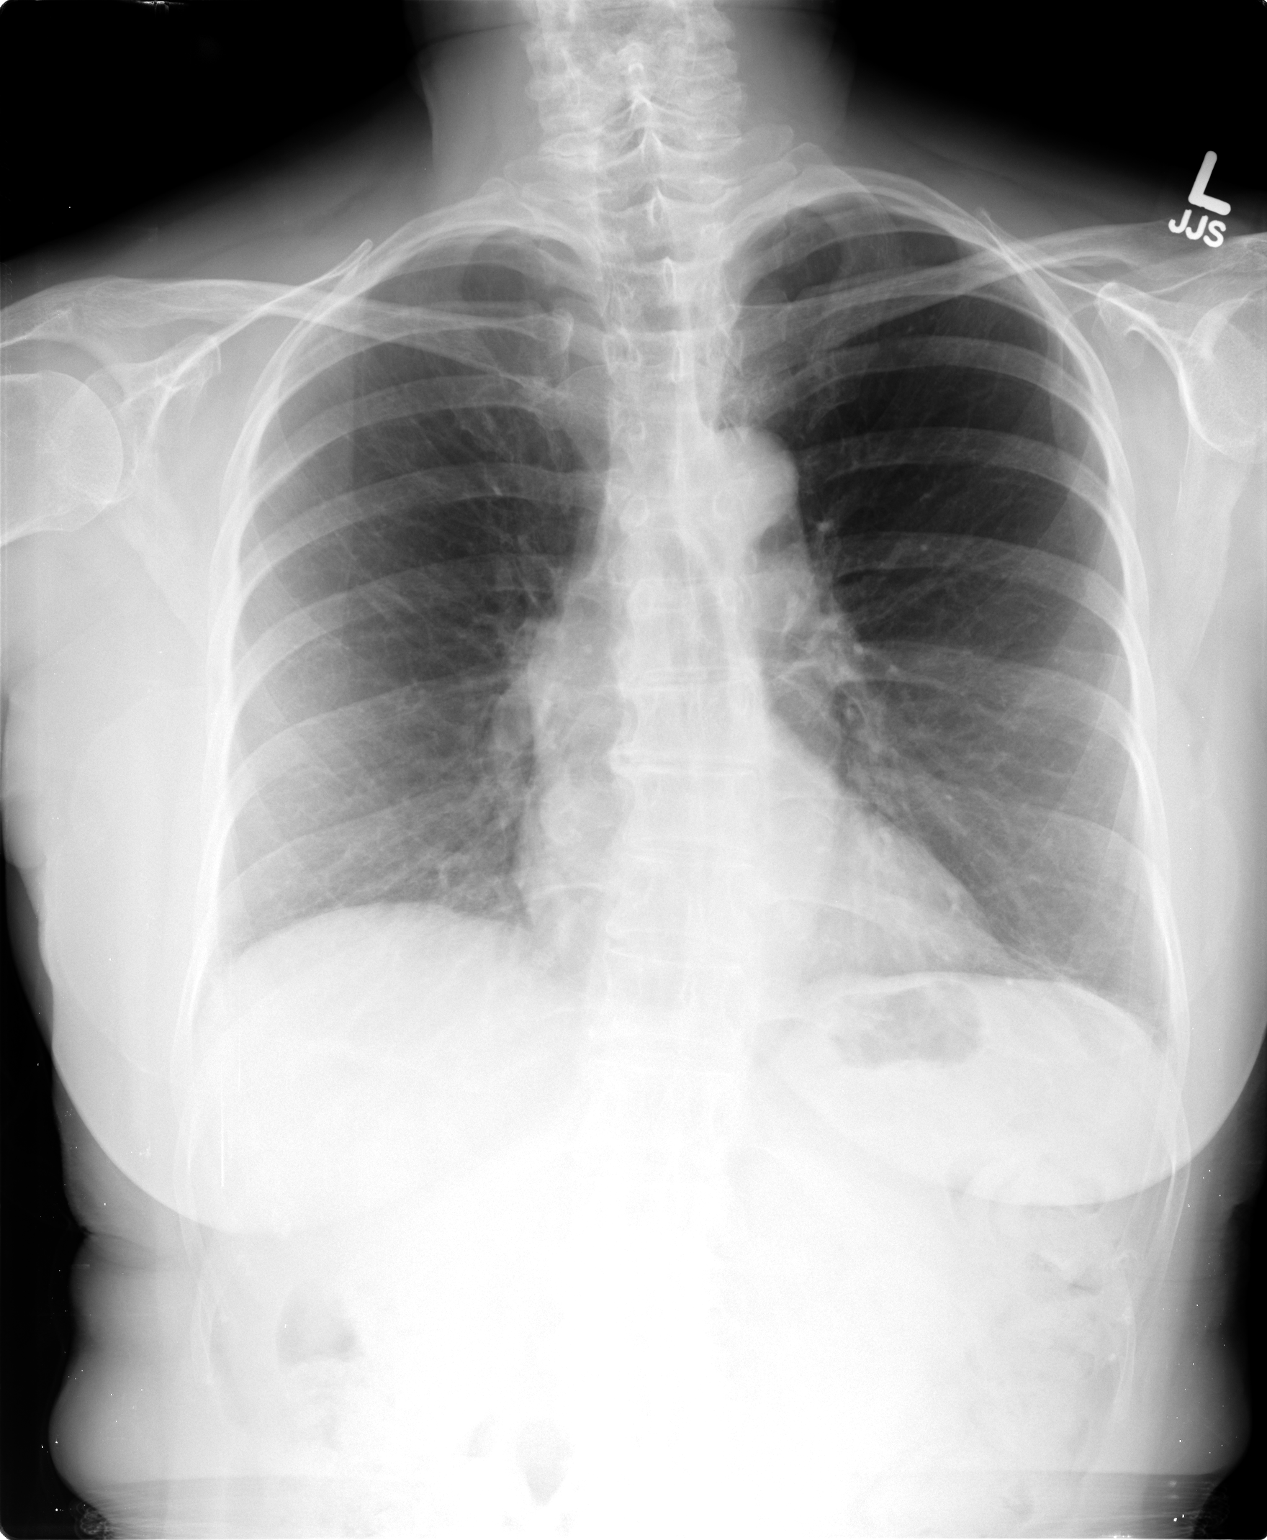

[view not recorded (2 of 2)]
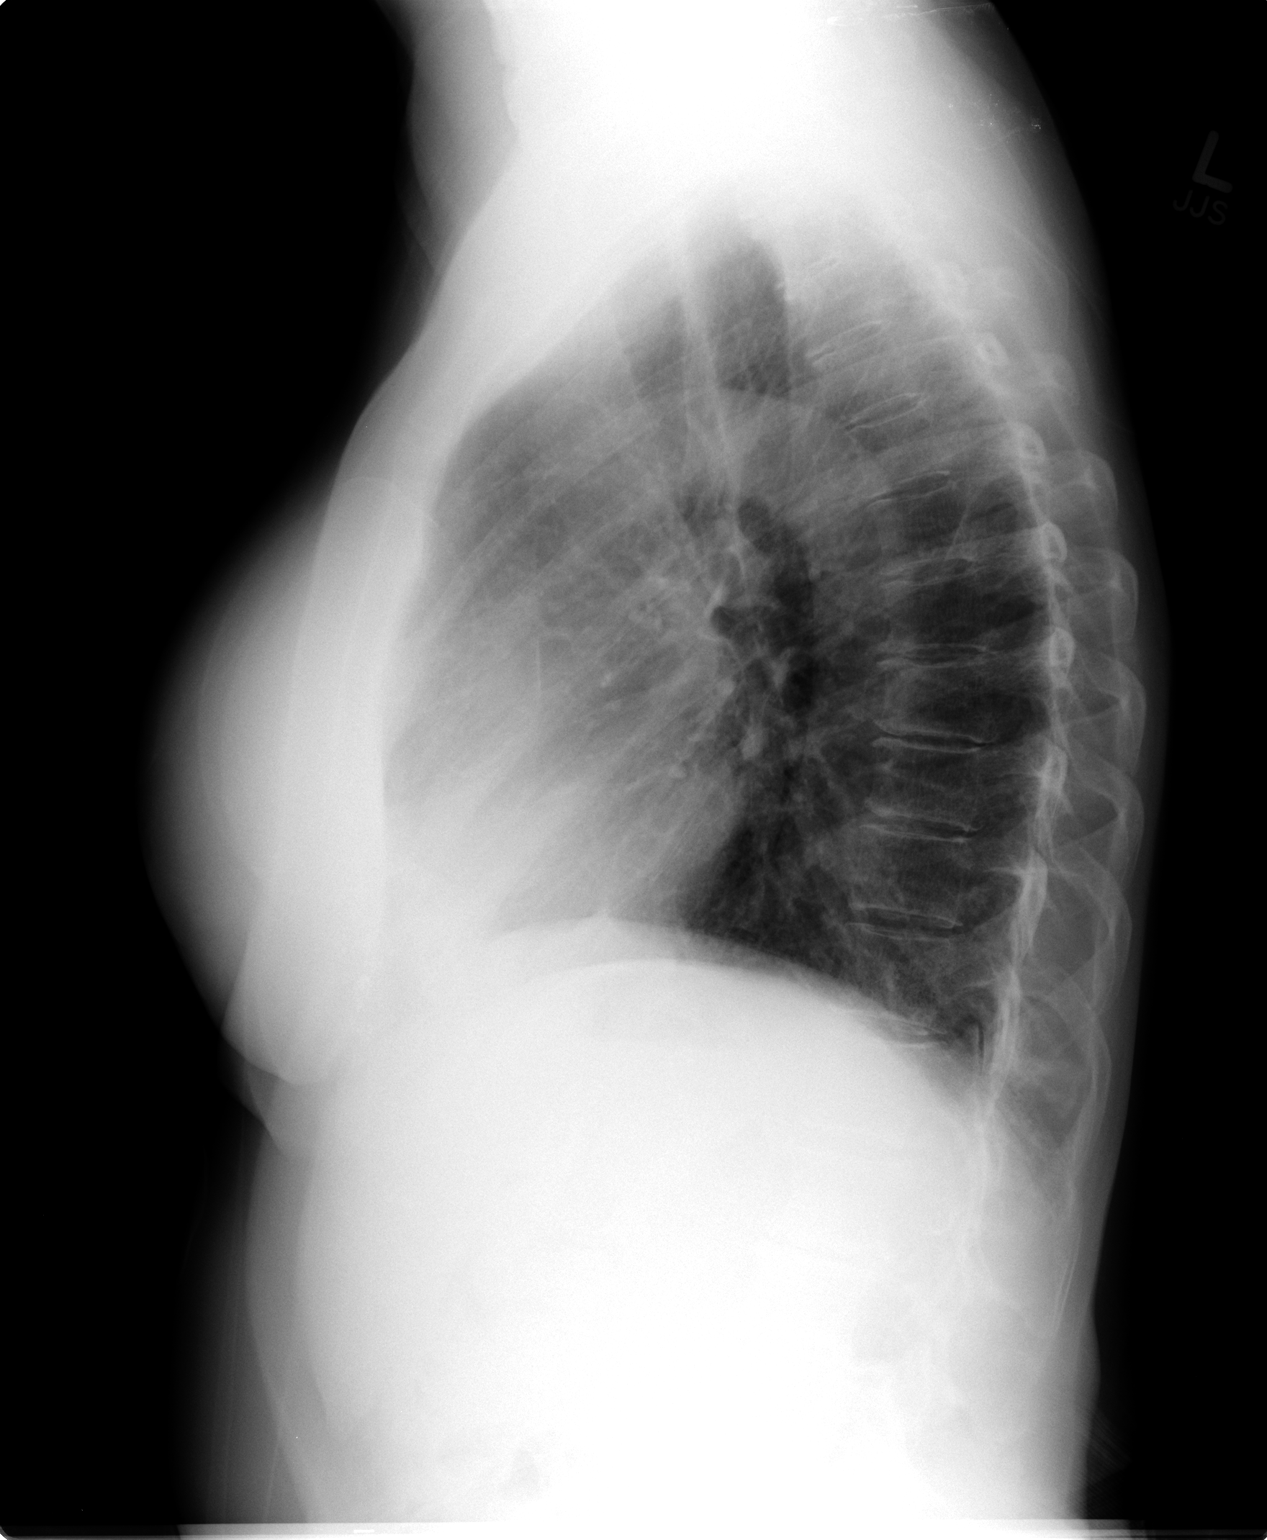

[2 of 2 positions shown; findings below may reference images not displayed]

FINDINGS: Cardiomediastinal silhouette is stable.  No acute
infiltrate or pleural effusion.  No pulmonary edema.  Minimal
degenerative changes thoracic spine.
IMPRESSION: No active disease.  Minimal degenerative changes thoracic spine.

## 2014-07-22 DIAGNOSIS — K529 Noninfective gastroenteritis and colitis, unspecified: Secondary | ICD-10-CM | POA: Diagnosis not present

## 2014-07-22 DIAGNOSIS — R1110 Vomiting, unspecified: Secondary | ICD-10-CM | POA: Diagnosis not present

## 2014-07-22 DIAGNOSIS — M1990 Unspecified osteoarthritis, unspecified site: Secondary | ICD-10-CM | POA: Diagnosis not present

## 2014-07-22 DIAGNOSIS — E785 Hyperlipidemia, unspecified: Secondary | ICD-10-CM | POA: Diagnosis not present

## 2014-07-22 DIAGNOSIS — Z23 Encounter for immunization: Secondary | ICD-10-CM | POA: Diagnosis not present

## 2014-08-05 DIAGNOSIS — K2270 Barrett's esophagus without dysplasia: Secondary | ICD-10-CM | POA: Diagnosis not present

## 2014-08-05 DIAGNOSIS — K259 Gastric ulcer, unspecified as acute or chronic, without hemorrhage or perforation: Secondary | ICD-10-CM | POA: Diagnosis not present

## 2014-08-05 DIAGNOSIS — R1013 Epigastric pain: Secondary | ICD-10-CM | POA: Diagnosis not present

## 2014-08-05 DIAGNOSIS — K5790 Diverticulosis of intestine, part unspecified, without perforation or abscess without bleeding: Secondary | ICD-10-CM | POA: Diagnosis not present

## 2014-08-05 DIAGNOSIS — K5730 Diverticulosis of large intestine without perforation or abscess without bleeding: Secondary | ICD-10-CM | POA: Diagnosis not present

## 2014-08-05 DIAGNOSIS — K599 Functional intestinal disorder, unspecified: Secondary | ICD-10-CM | POA: Diagnosis not present

## 2014-08-05 DIAGNOSIS — K529 Noninfective gastroenteritis and colitis, unspecified: Secondary | ICD-10-CM | POA: Diagnosis not present

## 2014-08-05 DIAGNOSIS — K219 Gastro-esophageal reflux disease without esophagitis: Secondary | ICD-10-CM | POA: Diagnosis not present

## 2014-08-05 DIAGNOSIS — K2980 Duodenitis without bleeding: Secondary | ICD-10-CM | POA: Diagnosis not present

## 2014-08-05 DIAGNOSIS — K449 Diaphragmatic hernia without obstruction or gangrene: Secondary | ICD-10-CM | POA: Diagnosis not present

## 2014-08-05 DIAGNOSIS — Z86010 Personal history of colonic polyps: Secondary | ICD-10-CM | POA: Diagnosis not present

## 2014-08-05 DIAGNOSIS — R197 Diarrhea, unspecified: Secondary | ICD-10-CM | POA: Diagnosis not present

## 2014-08-27 DIAGNOSIS — R197 Diarrhea, unspecified: Secondary | ICD-10-CM | POA: Diagnosis not present

## 2014-08-27 DIAGNOSIS — R1013 Epigastric pain: Secondary | ICD-10-CM | POA: Diagnosis not present

## 2014-08-27 DIAGNOSIS — R140 Abdominal distension (gaseous): Secondary | ICD-10-CM | POA: Diagnosis not present

## 2014-08-27 DIAGNOSIS — R110 Nausea: Secondary | ICD-10-CM | POA: Diagnosis not present

## 2014-09-25 DIAGNOSIS — R197 Diarrhea, unspecified: Secondary | ICD-10-CM | POA: Diagnosis not present

## 2014-09-25 DIAGNOSIS — R140 Abdominal distension (gaseous): Secondary | ICD-10-CM | POA: Diagnosis not present

## 2014-10-14 DIAGNOSIS — R7989 Other specified abnormal findings of blood chemistry: Secondary | ICD-10-CM | POA: Diagnosis not present

## 2014-10-14 DIAGNOSIS — E039 Hypothyroidism, unspecified: Secondary | ICD-10-CM | POA: Diagnosis not present

## 2014-10-14 DIAGNOSIS — E782 Mixed hyperlipidemia: Secondary | ICD-10-CM | POA: Diagnosis not present

## 2014-10-14 DIAGNOSIS — E559 Vitamin D deficiency, unspecified: Secondary | ICD-10-CM | POA: Diagnosis not present

## 2014-10-14 DIAGNOSIS — N951 Menopausal and female climacteric states: Secondary | ICD-10-CM | POA: Diagnosis not present

## 2014-10-20 DIAGNOSIS — K30 Functional dyspepsia: Secondary | ICD-10-CM | POA: Diagnosis not present

## 2014-10-20 DIAGNOSIS — E039 Hypothyroidism, unspecified: Secondary | ICD-10-CM | POA: Diagnosis not present

## 2014-10-20 DIAGNOSIS — R1013 Epigastric pain: Secondary | ICD-10-CM | POA: Diagnosis not present

## 2014-10-20 DIAGNOSIS — E079 Disorder of thyroid, unspecified: Secondary | ICD-10-CM | POA: Diagnosis not present

## 2014-10-20 DIAGNOSIS — K2970 Gastritis, unspecified, without bleeding: Secondary | ICD-10-CM | POA: Diagnosis not present

## 2014-10-20 DIAGNOSIS — K900 Celiac disease: Secondary | ICD-10-CM | POA: Diagnosis not present

## 2014-10-20 DIAGNOSIS — K2930 Chronic superficial gastritis without bleeding: Secondary | ICD-10-CM | POA: Diagnosis not present

## 2014-10-20 DIAGNOSIS — K219 Gastro-esophageal reflux disease without esophagitis: Secondary | ICD-10-CM | POA: Diagnosis not present

## 2014-10-20 DIAGNOSIS — K2270 Barrett's esophagus without dysplasia: Secondary | ICD-10-CM | POA: Diagnosis not present

## 2014-10-28 DIAGNOSIS — N951 Menopausal and female climacteric states: Secondary | ICD-10-CM | POA: Diagnosis not present

## 2014-11-07 DIAGNOSIS — M47899 Other spondylosis, site unspecified: Secondary | ICD-10-CM | POA: Diagnosis not present

## 2014-11-07 DIAGNOSIS — M2550 Pain in unspecified joint: Secondary | ICD-10-CM | POA: Diagnosis not present

## 2014-11-07 DIAGNOSIS — M150 Primary generalized (osteo)arthritis: Secondary | ICD-10-CM | POA: Diagnosis not present

## 2014-11-07 DIAGNOSIS — M797 Fibromyalgia: Secondary | ICD-10-CM | POA: Diagnosis not present

## 2014-11-20 DIAGNOSIS — M9903 Segmental and somatic dysfunction of lumbar region: Secondary | ICD-10-CM | POA: Diagnosis not present

## 2014-11-20 DIAGNOSIS — M545 Low back pain: Secondary | ICD-10-CM | POA: Diagnosis not present

## 2014-11-20 DIAGNOSIS — M5136 Other intervertebral disc degeneration, lumbar region: Secondary | ICD-10-CM | POA: Diagnosis not present

## 2014-11-20 DIAGNOSIS — M9913 Subluxation complex (vertebral) of lumbar region: Secondary | ICD-10-CM | POA: Diagnosis not present

## 2014-11-25 DIAGNOSIS — M5136 Other intervertebral disc degeneration, lumbar region: Secondary | ICD-10-CM | POA: Diagnosis not present

## 2014-11-25 DIAGNOSIS — M9903 Segmental and somatic dysfunction of lumbar region: Secondary | ICD-10-CM | POA: Diagnosis not present

## 2014-11-25 DIAGNOSIS — M545 Low back pain: Secondary | ICD-10-CM | POA: Diagnosis not present

## 2014-11-25 DIAGNOSIS — M9913 Subluxation complex (vertebral) of lumbar region: Secondary | ICD-10-CM | POA: Diagnosis not present

## 2014-11-26 DIAGNOSIS — M5136 Other intervertebral disc degeneration, lumbar region: Secondary | ICD-10-CM | POA: Diagnosis not present

## 2014-11-26 DIAGNOSIS — M9903 Segmental and somatic dysfunction of lumbar region: Secondary | ICD-10-CM | POA: Diagnosis not present

## 2014-11-26 DIAGNOSIS — M545 Low back pain: Secondary | ICD-10-CM | POA: Diagnosis not present

## 2014-11-26 DIAGNOSIS — M9913 Subluxation complex (vertebral) of lumbar region: Secondary | ICD-10-CM | POA: Diagnosis not present

## 2014-11-27 DIAGNOSIS — M5136 Other intervertebral disc degeneration, lumbar region: Secondary | ICD-10-CM | POA: Diagnosis not present

## 2014-11-27 DIAGNOSIS — M545 Low back pain: Secondary | ICD-10-CM | POA: Diagnosis not present

## 2014-11-27 DIAGNOSIS — M9903 Segmental and somatic dysfunction of lumbar region: Secondary | ICD-10-CM | POA: Diagnosis not present

## 2014-11-27 DIAGNOSIS — M9913 Subluxation complex (vertebral) of lumbar region: Secondary | ICD-10-CM | POA: Diagnosis not present

## 2014-12-02 DIAGNOSIS — M5136 Other intervertebral disc degeneration, lumbar region: Secondary | ICD-10-CM | POA: Diagnosis not present

## 2014-12-02 DIAGNOSIS — M9903 Segmental and somatic dysfunction of lumbar region: Secondary | ICD-10-CM | POA: Diagnosis not present

## 2014-12-02 DIAGNOSIS — M9913 Subluxation complex (vertebral) of lumbar region: Secondary | ICD-10-CM | POA: Diagnosis not present

## 2014-12-02 DIAGNOSIS — M545 Low back pain: Secondary | ICD-10-CM | POA: Diagnosis not present

## 2014-12-03 DIAGNOSIS — E785 Hyperlipidemia, unspecified: Secondary | ICD-10-CM | POA: Diagnosis not present

## 2014-12-03 DIAGNOSIS — Z780 Asymptomatic menopausal state: Secondary | ICD-10-CM | POA: Diagnosis not present

## 2014-12-03 DIAGNOSIS — K219 Gastro-esophageal reflux disease without esophagitis: Secondary | ICD-10-CM | POA: Diagnosis not present

## 2014-12-03 DIAGNOSIS — M797 Fibromyalgia: Secondary | ICD-10-CM | POA: Diagnosis not present

## 2014-12-03 DIAGNOSIS — Z1389 Encounter for screening for other disorder: Secondary | ICD-10-CM | POA: Diagnosis not present

## 2014-12-03 DIAGNOSIS — Z6824 Body mass index (BMI) 24.0-24.9, adult: Secondary | ICD-10-CM | POA: Diagnosis not present

## 2014-12-08 DIAGNOSIS — M9903 Segmental and somatic dysfunction of lumbar region: Secondary | ICD-10-CM | POA: Diagnosis not present

## 2014-12-08 DIAGNOSIS — M5136 Other intervertebral disc degeneration, lumbar region: Secondary | ICD-10-CM | POA: Diagnosis not present

## 2014-12-08 DIAGNOSIS — M9913 Subluxation complex (vertebral) of lumbar region: Secondary | ICD-10-CM | POA: Diagnosis not present

## 2014-12-08 DIAGNOSIS — M545 Low back pain: Secondary | ICD-10-CM | POA: Diagnosis not present

## 2014-12-15 DIAGNOSIS — M9913 Subluxation complex (vertebral) of lumbar region: Secondary | ICD-10-CM | POA: Diagnosis not present

## 2014-12-15 DIAGNOSIS — M545 Low back pain: Secondary | ICD-10-CM | POA: Diagnosis not present

## 2014-12-15 DIAGNOSIS — M5136 Other intervertebral disc degeneration, lumbar region: Secondary | ICD-10-CM | POA: Diagnosis not present

## 2014-12-15 DIAGNOSIS — M9903 Segmental and somatic dysfunction of lumbar region: Secondary | ICD-10-CM | POA: Diagnosis not present

## 2014-12-22 DIAGNOSIS — M5136 Other intervertebral disc degeneration, lumbar region: Secondary | ICD-10-CM | POA: Diagnosis not present

## 2014-12-22 DIAGNOSIS — M9913 Subluxation complex (vertebral) of lumbar region: Secondary | ICD-10-CM | POA: Diagnosis not present

## 2014-12-22 DIAGNOSIS — M545 Low back pain: Secondary | ICD-10-CM | POA: Diagnosis not present

## 2014-12-22 DIAGNOSIS — M9903 Segmental and somatic dysfunction of lumbar region: Secondary | ICD-10-CM | POA: Diagnosis not present

## 2014-12-25 DIAGNOSIS — Z9882 Breast implant status: Secondary | ICD-10-CM | POA: Diagnosis not present

## 2014-12-25 DIAGNOSIS — M549 Dorsalgia, unspecified: Secondary | ICD-10-CM | POA: Diagnosis not present

## 2014-12-25 DIAGNOSIS — M2540 Effusion, unspecified joint: Secondary | ICD-10-CM | POA: Diagnosis not present

## 2014-12-25 DIAGNOSIS — K2270 Barrett's esophagus without dysplasia: Secondary | ICD-10-CM | POA: Diagnosis not present

## 2014-12-25 DIAGNOSIS — M2550 Pain in unspecified joint: Secondary | ICD-10-CM | POA: Diagnosis not present

## 2014-12-25 DIAGNOSIS — R1013 Epigastric pain: Secondary | ICD-10-CM | POA: Diagnosis not present

## 2014-12-25 DIAGNOSIS — Z90710 Acquired absence of both cervix and uterus: Secondary | ICD-10-CM | POA: Diagnosis not present

## 2014-12-25 DIAGNOSIS — K900 Celiac disease: Secondary | ICD-10-CM | POA: Diagnosis not present

## 2014-12-25 DIAGNOSIS — R140 Abdominal distension (gaseous): Secondary | ICD-10-CM | POA: Diagnosis not present

## 2014-12-25 DIAGNOSIS — M791 Myalgia: Secondary | ICD-10-CM | POA: Diagnosis not present

## 2014-12-25 DIAGNOSIS — R5383 Other fatigue: Secondary | ICD-10-CM | POA: Diagnosis not present

## 2014-12-25 DIAGNOSIS — R197 Diarrhea, unspecified: Secondary | ICD-10-CM | POA: Diagnosis not present

## 2014-12-25 DIAGNOSIS — Z8619 Personal history of other infectious and parasitic diseases: Secondary | ICD-10-CM | POA: Diagnosis not present

## 2014-12-26 DIAGNOSIS — R1013 Epigastric pain: Secondary | ICD-10-CM | POA: Diagnosis not present

## 2014-12-29 DIAGNOSIS — M5136 Other intervertebral disc degeneration, lumbar region: Secondary | ICD-10-CM | POA: Diagnosis not present

## 2014-12-29 DIAGNOSIS — M9913 Subluxation complex (vertebral) of lumbar region: Secondary | ICD-10-CM | POA: Diagnosis not present

## 2014-12-29 DIAGNOSIS — M9903 Segmental and somatic dysfunction of lumbar region: Secondary | ICD-10-CM | POA: Diagnosis not present

## 2014-12-29 DIAGNOSIS — M545 Low back pain: Secondary | ICD-10-CM | POA: Diagnosis not present

## 2015-01-06 DIAGNOSIS — M5136 Other intervertebral disc degeneration, lumbar region: Secondary | ICD-10-CM | POA: Diagnosis not present

## 2015-01-06 DIAGNOSIS — M9913 Subluxation complex (vertebral) of lumbar region: Secondary | ICD-10-CM | POA: Diagnosis not present

## 2015-01-06 DIAGNOSIS — M545 Low back pain: Secondary | ICD-10-CM | POA: Diagnosis not present

## 2015-01-06 DIAGNOSIS — M9903 Segmental and somatic dysfunction of lumbar region: Secondary | ICD-10-CM | POA: Diagnosis not present

## 2015-01-13 DIAGNOSIS — M5136 Other intervertebral disc degeneration, lumbar region: Secondary | ICD-10-CM | POA: Diagnosis not present

## 2015-01-13 DIAGNOSIS — M9913 Subluxation complex (vertebral) of lumbar region: Secondary | ICD-10-CM | POA: Diagnosis not present

## 2015-01-13 DIAGNOSIS — M545 Low back pain: Secondary | ICD-10-CM | POA: Diagnosis not present

## 2015-01-13 DIAGNOSIS — M9903 Segmental and somatic dysfunction of lumbar region: Secondary | ICD-10-CM | POA: Diagnosis not present

## 2015-01-19 DIAGNOSIS — M9903 Segmental and somatic dysfunction of lumbar region: Secondary | ICD-10-CM | POA: Diagnosis not present

## 2015-01-19 DIAGNOSIS — M9913 Subluxation complex (vertebral) of lumbar region: Secondary | ICD-10-CM | POA: Diagnosis not present

## 2015-01-19 DIAGNOSIS — M545 Low back pain: Secondary | ICD-10-CM | POA: Diagnosis not present

## 2015-01-19 DIAGNOSIS — M5136 Other intervertebral disc degeneration, lumbar region: Secondary | ICD-10-CM | POA: Diagnosis not present

## 2015-01-26 DIAGNOSIS — M9903 Segmental and somatic dysfunction of lumbar region: Secondary | ICD-10-CM | POA: Diagnosis not present

## 2015-01-26 DIAGNOSIS — M545 Low back pain: Secondary | ICD-10-CM | POA: Diagnosis not present

## 2015-01-26 DIAGNOSIS — R350 Frequency of micturition: Secondary | ICD-10-CM | POA: Diagnosis not present

## 2015-01-26 DIAGNOSIS — M9913 Subluxation complex (vertebral) of lumbar region: Secondary | ICD-10-CM | POA: Diagnosis not present

## 2015-01-26 DIAGNOSIS — M5136 Other intervertebral disc degeneration, lumbar region: Secondary | ICD-10-CM | POA: Diagnosis not present

## 2015-01-27 DIAGNOSIS — R7989 Other specified abnormal findings of blood chemistry: Secondary | ICD-10-CM | POA: Diagnosis not present

## 2015-01-27 DIAGNOSIS — E782 Mixed hyperlipidemia: Secondary | ICD-10-CM | POA: Diagnosis not present

## 2015-01-27 DIAGNOSIS — I10 Essential (primary) hypertension: Secondary | ICD-10-CM | POA: Diagnosis not present

## 2015-01-27 DIAGNOSIS — E780 Pure hypercholesterolemia: Secondary | ICD-10-CM | POA: Diagnosis not present

## 2015-01-27 DIAGNOSIS — E039 Hypothyroidism, unspecified: Secondary | ICD-10-CM | POA: Diagnosis not present

## 2015-01-27 DIAGNOSIS — E559 Vitamin D deficiency, unspecified: Secondary | ICD-10-CM | POA: Diagnosis not present

## 2015-01-27 DIAGNOSIS — N951 Menopausal and female climacteric states: Secondary | ICD-10-CM | POA: Diagnosis not present

## 2015-01-28 DIAGNOSIS — R1013 Epigastric pain: Secondary | ICD-10-CM | POA: Diagnosis not present

## 2015-01-28 DIAGNOSIS — K219 Gastro-esophageal reflux disease without esophagitis: Secondary | ICD-10-CM | POA: Diagnosis not present

## 2015-01-28 DIAGNOSIS — K900 Celiac disease: Secondary | ICD-10-CM | POA: Diagnosis not present

## 2015-01-29 DIAGNOSIS — R1013 Epigastric pain: Secondary | ICD-10-CM | POA: Diagnosis not present

## 2015-01-29 DIAGNOSIS — K900 Celiac disease: Secondary | ICD-10-CM | POA: Diagnosis not present

## 2015-01-29 DIAGNOSIS — R197 Diarrhea, unspecified: Secondary | ICD-10-CM | POA: Diagnosis not present

## 2015-01-29 DIAGNOSIS — R140 Abdominal distension (gaseous): Secondary | ICD-10-CM | POA: Diagnosis not present

## 2015-01-29 DIAGNOSIS — A071 Giardiasis [lambliasis]: Secondary | ICD-10-CM | POA: Diagnosis not present

## 2015-02-03 DIAGNOSIS — N951 Menopausal and female climacteric states: Secondary | ICD-10-CM | POA: Diagnosis not present

## 2015-02-05 DIAGNOSIS — M9903 Segmental and somatic dysfunction of lumbar region: Secondary | ICD-10-CM | POA: Diagnosis not present

## 2015-02-05 DIAGNOSIS — M9913 Subluxation complex (vertebral) of lumbar region: Secondary | ICD-10-CM | POA: Diagnosis not present

## 2015-02-05 DIAGNOSIS — M5136 Other intervertebral disc degeneration, lumbar region: Secondary | ICD-10-CM | POA: Diagnosis not present

## 2015-02-05 DIAGNOSIS — M545 Low back pain: Secondary | ICD-10-CM | POA: Diagnosis not present

## 2015-02-16 DIAGNOSIS — D485 Neoplasm of uncertain behavior of skin: Secondary | ICD-10-CM | POA: Diagnosis not present

## 2015-02-16 DIAGNOSIS — Z85828 Personal history of other malignant neoplasm of skin: Secondary | ICD-10-CM | POA: Diagnosis not present

## 2015-02-16 DIAGNOSIS — L853 Xerosis cutis: Secondary | ICD-10-CM | POA: Diagnosis not present

## 2015-02-16 DIAGNOSIS — D225 Melanocytic nevi of trunk: Secondary | ICD-10-CM | POA: Diagnosis not present

## 2015-02-16 DIAGNOSIS — B351 Tinea unguium: Secondary | ICD-10-CM | POA: Diagnosis not present

## 2015-02-16 DIAGNOSIS — D044 Carcinoma in situ of skin of scalp and neck: Secondary | ICD-10-CM | POA: Diagnosis not present

## 2015-02-16 DIAGNOSIS — L814 Other melanin hyperpigmentation: Secondary | ICD-10-CM | POA: Diagnosis not present

## 2015-02-16 DIAGNOSIS — L570 Actinic keratosis: Secondary | ICD-10-CM | POA: Diagnosis not present

## 2015-02-17 DIAGNOSIS — M9903 Segmental and somatic dysfunction of lumbar region: Secondary | ICD-10-CM | POA: Diagnosis not present

## 2015-02-17 DIAGNOSIS — M545 Low back pain: Secondary | ICD-10-CM | POA: Diagnosis not present

## 2015-02-17 DIAGNOSIS — M9913 Subluxation complex (vertebral) of lumbar region: Secondary | ICD-10-CM | POA: Diagnosis not present

## 2015-02-17 DIAGNOSIS — M5136 Other intervertebral disc degeneration, lumbar region: Secondary | ICD-10-CM | POA: Diagnosis not present

## 2015-02-27 DIAGNOSIS — R197 Diarrhea, unspecified: Secondary | ICD-10-CM | POA: Diagnosis not present

## 2015-02-27 DIAGNOSIS — R140 Abdominal distension (gaseous): Secondary | ICD-10-CM | POA: Diagnosis not present

## 2015-02-27 DIAGNOSIS — K900 Celiac disease: Secondary | ICD-10-CM | POA: Diagnosis not present

## 2015-03-03 DIAGNOSIS — M545 Low back pain: Secondary | ICD-10-CM | POA: Diagnosis not present

## 2015-03-03 DIAGNOSIS — M9913 Subluxation complex (vertebral) of lumbar region: Secondary | ICD-10-CM | POA: Diagnosis not present

## 2015-03-03 DIAGNOSIS — M9903 Segmental and somatic dysfunction of lumbar region: Secondary | ICD-10-CM | POA: Diagnosis not present

## 2015-03-03 DIAGNOSIS — M5136 Other intervertebral disc degeneration, lumbar region: Secondary | ICD-10-CM | POA: Diagnosis not present

## 2015-03-05 DIAGNOSIS — H40013 Open angle with borderline findings, low risk, bilateral: Secondary | ICD-10-CM | POA: Diagnosis not present

## 2015-03-05 DIAGNOSIS — H10413 Chronic giant papillary conjunctivitis, bilateral: Secondary | ICD-10-CM | POA: Diagnosis not present

## 2015-03-05 DIAGNOSIS — H04123 Dry eye syndrome of bilateral lacrimal glands: Secondary | ICD-10-CM | POA: Diagnosis not present

## 2015-03-05 DIAGNOSIS — H01001 Unspecified blepharitis right upper eyelid: Secondary | ICD-10-CM | POA: Diagnosis not present

## 2015-03-13 DIAGNOSIS — R5383 Other fatigue: Secondary | ICD-10-CM | POA: Diagnosis not present

## 2015-03-13 DIAGNOSIS — D8989 Other specified disorders involving the immune mechanism, not elsewhere classified: Secondary | ICD-10-CM | POA: Diagnosis not present

## 2015-03-13 DIAGNOSIS — K580 Irritable bowel syndrome with diarrhea: Secondary | ICD-10-CM | POA: Diagnosis not present

## 2015-03-15 DIAGNOSIS — M810 Age-related osteoporosis without current pathological fracture: Secondary | ICD-10-CM | POA: Diagnosis not present

## 2015-03-16 DIAGNOSIS — R5383 Other fatigue: Secondary | ICD-10-CM | POA: Diagnosis not present

## 2015-03-17 DIAGNOSIS — D8989 Other specified disorders involving the immune mechanism, not elsewhere classified: Secondary | ICD-10-CM | POA: Diagnosis not present

## 2015-03-17 DIAGNOSIS — E230 Hypopituitarism: Secondary | ICD-10-CM | POA: Diagnosis not present

## 2015-03-17 DIAGNOSIS — E039 Hypothyroidism, unspecified: Secondary | ICD-10-CM | POA: Diagnosis not present

## 2015-03-17 DIAGNOSIS — R5383 Other fatigue: Secondary | ICD-10-CM | POA: Diagnosis not present

## 2015-03-17 DIAGNOSIS — M489 Spondylopathy, unspecified: Secondary | ICD-10-CM | POA: Diagnosis not present

## 2015-03-17 DIAGNOSIS — E782 Mixed hyperlipidemia: Secondary | ICD-10-CM | POA: Diagnosis not present

## 2015-03-17 DIAGNOSIS — K580 Irritable bowel syndrome with diarrhea: Secondary | ICD-10-CM | POA: Diagnosis not present

## 2015-03-17 DIAGNOSIS — M797 Fibromyalgia: Secondary | ICD-10-CM | POA: Diagnosis not present

## 2015-03-17 DIAGNOSIS — E232 Diabetes insipidus: Secondary | ICD-10-CM | POA: Diagnosis not present

## 2015-03-18 DIAGNOSIS — D528 Other folate deficiency anemias: Secondary | ICD-10-CM | POA: Diagnosis not present

## 2015-03-18 DIAGNOSIS — E538 Deficiency of other specified B group vitamins: Secondary | ICD-10-CM | POA: Diagnosis not present

## 2015-03-18 DIAGNOSIS — E7141 Primary carnitine deficiency: Secondary | ICD-10-CM | POA: Diagnosis not present

## 2015-03-18 DIAGNOSIS — E531 Pyridoxine deficiency: Secondary | ICD-10-CM | POA: Diagnosis not present

## 2015-03-19 DIAGNOSIS — M9903 Segmental and somatic dysfunction of lumbar region: Secondary | ICD-10-CM | POA: Diagnosis not present

## 2015-03-19 DIAGNOSIS — M9913 Subluxation complex (vertebral) of lumbar region: Secondary | ICD-10-CM | POA: Diagnosis not present

## 2015-03-19 DIAGNOSIS — M545 Low back pain: Secondary | ICD-10-CM | POA: Diagnosis not present

## 2015-03-19 DIAGNOSIS — M5136 Other intervertebral disc degeneration, lumbar region: Secondary | ICD-10-CM | POA: Diagnosis not present

## 2015-03-27 DIAGNOSIS — K219 Gastro-esophageal reflux disease without esophagitis: Secondary | ICD-10-CM | POA: Diagnosis not present

## 2015-03-27 DIAGNOSIS — R109 Unspecified abdominal pain: Secondary | ICD-10-CM | POA: Diagnosis not present

## 2015-03-27 DIAGNOSIS — K900 Celiac disease: Secondary | ICD-10-CM | POA: Diagnosis not present

## 2015-03-30 DIAGNOSIS — M9913 Subluxation complex (vertebral) of lumbar region: Secondary | ICD-10-CM | POA: Diagnosis not present

## 2015-03-30 DIAGNOSIS — M5136 Other intervertebral disc degeneration, lumbar region: Secondary | ICD-10-CM | POA: Diagnosis not present

## 2015-03-30 DIAGNOSIS — M9903 Segmental and somatic dysfunction of lumbar region: Secondary | ICD-10-CM | POA: Diagnosis not present

## 2015-03-30 DIAGNOSIS — M545 Low back pain: Secondary | ICD-10-CM | POA: Diagnosis not present

## 2015-04-16 DIAGNOSIS — R140 Abdominal distension (gaseous): Secondary | ICD-10-CM | POA: Diagnosis not present

## 2015-04-16 DIAGNOSIS — R197 Diarrhea, unspecified: Secondary | ICD-10-CM | POA: Diagnosis not present

## 2015-04-20 DIAGNOSIS — M545 Low back pain: Secondary | ICD-10-CM | POA: Diagnosis not present

## 2015-04-20 DIAGNOSIS — M9903 Segmental and somatic dysfunction of lumbar region: Secondary | ICD-10-CM | POA: Diagnosis not present

## 2015-04-20 DIAGNOSIS — M5136 Other intervertebral disc degeneration, lumbar region: Secondary | ICD-10-CM | POA: Diagnosis not present

## 2015-04-20 DIAGNOSIS — M9913 Subluxation complex (vertebral) of lumbar region: Secondary | ICD-10-CM | POA: Diagnosis not present

## 2015-04-24 DIAGNOSIS — E039 Hypothyroidism, unspecified: Secondary | ICD-10-CM | POA: Diagnosis not present

## 2015-04-28 DIAGNOSIS — L570 Actinic keratosis: Secondary | ICD-10-CM | POA: Diagnosis not present

## 2015-04-28 DIAGNOSIS — Z85828 Personal history of other malignant neoplasm of skin: Secondary | ICD-10-CM | POA: Diagnosis not present

## 2015-05-12 DIAGNOSIS — N951 Menopausal and female climacteric states: Secondary | ICD-10-CM | POA: Diagnosis not present

## 2015-05-25 DIAGNOSIS — M9913 Subluxation complex (vertebral) of lumbar region: Secondary | ICD-10-CM | POA: Diagnosis not present

## 2015-05-25 DIAGNOSIS — M5136 Other intervertebral disc degeneration, lumbar region: Secondary | ICD-10-CM | POA: Diagnosis not present

## 2015-05-25 DIAGNOSIS — M545 Low back pain: Secondary | ICD-10-CM | POA: Diagnosis not present

## 2015-05-25 DIAGNOSIS — M9903 Segmental and somatic dysfunction of lumbar region: Secondary | ICD-10-CM | POA: Diagnosis not present

## 2015-06-04 DIAGNOSIS — R21 Rash and other nonspecific skin eruption: Secondary | ICD-10-CM | POA: Diagnosis not present

## 2015-06-04 DIAGNOSIS — R5383 Other fatigue: Secondary | ICD-10-CM | POA: Diagnosis not present

## 2015-06-04 DIAGNOSIS — Z77120 Contact with and (suspected) exposure to mold (toxic): Secondary | ICD-10-CM | POA: Diagnosis not present

## 2015-06-12 DIAGNOSIS — E785 Hyperlipidemia, unspecified: Secondary | ICD-10-CM | POA: Diagnosis not present

## 2015-06-12 DIAGNOSIS — Z862 Personal history of diseases of the blood and blood-forming organs and certain disorders involving the immune mechanism: Secondary | ICD-10-CM | POA: Diagnosis not present

## 2015-06-17 DIAGNOSIS — M797 Fibromyalgia: Secondary | ICD-10-CM | POA: Diagnosis not present

## 2015-06-17 DIAGNOSIS — Z0000 Encounter for general adult medical examination without abnormal findings: Secondary | ICD-10-CM | POA: Diagnosis not present

## 2015-06-17 DIAGNOSIS — K449 Diaphragmatic hernia without obstruction or gangrene: Secondary | ICD-10-CM | POA: Diagnosis not present

## 2015-06-17 DIAGNOSIS — Z23 Encounter for immunization: Secondary | ICD-10-CM | POA: Diagnosis not present

## 2015-06-17 DIAGNOSIS — Z1389 Encounter for screening for other disorder: Secondary | ICD-10-CM | POA: Diagnosis not present

## 2015-06-17 DIAGNOSIS — K259 Gastric ulcer, unspecified as acute or chronic, without hemorrhage or perforation: Secondary | ICD-10-CM | POA: Diagnosis not present

## 2015-06-17 DIAGNOSIS — M1990 Unspecified osteoarthritis, unspecified site: Secondary | ICD-10-CM | POA: Diagnosis not present

## 2015-06-17 DIAGNOSIS — K529 Noninfective gastroenteritis and colitis, unspecified: Secondary | ICD-10-CM | POA: Diagnosis not present

## 2015-06-22 DIAGNOSIS — M9903 Segmental and somatic dysfunction of lumbar region: Secondary | ICD-10-CM | POA: Diagnosis not present

## 2015-06-22 DIAGNOSIS — M9913 Subluxation complex (vertebral) of lumbar region: Secondary | ICD-10-CM | POA: Diagnosis not present

## 2015-06-22 DIAGNOSIS — M5136 Other intervertebral disc degeneration, lumbar region: Secondary | ICD-10-CM | POA: Diagnosis not present

## 2015-06-22 DIAGNOSIS — M545 Low back pain: Secondary | ICD-10-CM | POA: Diagnosis not present

## 2015-06-29 DIAGNOSIS — Z1212 Encounter for screening for malignant neoplasm of rectum: Secondary | ICD-10-CM | POA: Diagnosis not present

## 2015-07-01 DIAGNOSIS — K900 Celiac disease: Secondary | ICD-10-CM | POA: Diagnosis not present

## 2015-07-01 DIAGNOSIS — R1013 Epigastric pain: Secondary | ICD-10-CM | POA: Diagnosis not present

## 2015-07-13 DIAGNOSIS — E559 Vitamin D deficiency, unspecified: Secondary | ICD-10-CM | POA: Diagnosis not present

## 2015-07-13 DIAGNOSIS — G894 Chronic pain syndrome: Secondary | ICD-10-CM | POA: Diagnosis not present

## 2015-07-13 DIAGNOSIS — Z79890 Hormone replacement therapy (postmenopausal): Secondary | ICD-10-CM | POA: Diagnosis not present

## 2015-07-13 DIAGNOSIS — E039 Hypothyroidism, unspecified: Secondary | ICD-10-CM | POA: Diagnosis not present

## 2015-07-13 DIAGNOSIS — E538 Deficiency of other specified B group vitamins: Secondary | ICD-10-CM | POA: Diagnosis not present

## 2015-07-29 DIAGNOSIS — Z85828 Personal history of other malignant neoplasm of skin: Secondary | ICD-10-CM | POA: Diagnosis not present

## 2015-07-29 DIAGNOSIS — L570 Actinic keratosis: Secondary | ICD-10-CM | POA: Diagnosis not present

## 2015-08-04 DIAGNOSIS — E039 Hypothyroidism, unspecified: Secondary | ICD-10-CM | POA: Diagnosis not present

## 2015-08-04 DIAGNOSIS — E782 Mixed hyperlipidemia: Secondary | ICD-10-CM | POA: Diagnosis not present

## 2015-08-04 DIAGNOSIS — E559 Vitamin D deficiency, unspecified: Secondary | ICD-10-CM | POA: Diagnosis not present

## 2015-08-04 DIAGNOSIS — R7989 Other specified abnormal findings of blood chemistry: Secondary | ICD-10-CM | POA: Diagnosis not present

## 2015-08-04 DIAGNOSIS — N951 Menopausal and female climacteric states: Secondary | ICD-10-CM | POA: Diagnosis not present

## 2015-08-06 DIAGNOSIS — M5136 Other intervertebral disc degeneration, lumbar region: Secondary | ICD-10-CM | POA: Diagnosis not present

## 2015-08-06 DIAGNOSIS — M545 Low back pain: Secondary | ICD-10-CM | POA: Diagnosis not present

## 2015-08-06 DIAGNOSIS — M9913 Subluxation complex (vertebral) of lumbar region: Secondary | ICD-10-CM | POA: Diagnosis not present

## 2015-08-06 DIAGNOSIS — M9903 Segmental and somatic dysfunction of lumbar region: Secondary | ICD-10-CM | POA: Diagnosis not present

## 2015-08-11 DIAGNOSIS — N951 Menopausal and female climacteric states: Secondary | ICD-10-CM | POA: Diagnosis not present

## 2015-08-25 DIAGNOSIS — M5136 Other intervertebral disc degeneration, lumbar region: Secondary | ICD-10-CM | POA: Diagnosis not present

## 2015-08-25 DIAGNOSIS — M9913 Subluxation complex (vertebral) of lumbar region: Secondary | ICD-10-CM | POA: Diagnosis not present

## 2015-08-25 DIAGNOSIS — M9903 Segmental and somatic dysfunction of lumbar region: Secondary | ICD-10-CM | POA: Diagnosis not present

## 2015-08-25 DIAGNOSIS — M545 Low back pain: Secondary | ICD-10-CM | POA: Diagnosis not present

## 2015-09-01 DIAGNOSIS — M9913 Subluxation complex (vertebral) of lumbar region: Secondary | ICD-10-CM | POA: Diagnosis not present

## 2015-09-01 DIAGNOSIS — M545 Low back pain: Secondary | ICD-10-CM | POA: Diagnosis not present

## 2015-09-01 DIAGNOSIS — M9903 Segmental and somatic dysfunction of lumbar region: Secondary | ICD-10-CM | POA: Diagnosis not present

## 2015-09-01 DIAGNOSIS — M5136 Other intervertebral disc degeneration, lumbar region: Secondary | ICD-10-CM | POA: Diagnosis not present

## 2015-10-09 DIAGNOSIS — K900 Celiac disease: Secondary | ICD-10-CM | POA: Diagnosis not present

## 2015-10-09 DIAGNOSIS — K580 Irritable bowel syndrome with diarrhea: Secondary | ICD-10-CM | POA: Diagnosis not present

## 2015-10-29 DIAGNOSIS — Z85828 Personal history of other malignant neoplasm of skin: Secondary | ICD-10-CM | POA: Diagnosis not present

## 2015-10-29 DIAGNOSIS — L570 Actinic keratosis: Secondary | ICD-10-CM | POA: Diagnosis not present

## 2015-10-29 DIAGNOSIS — L821 Other seborrheic keratosis: Secondary | ICD-10-CM | POA: Diagnosis not present

## 2015-10-29 DIAGNOSIS — L218 Other seborrheic dermatitis: Secondary | ICD-10-CM | POA: Diagnosis not present

## 2015-11-17 DIAGNOSIS — R7989 Other specified abnormal findings of blood chemistry: Secondary | ICD-10-CM | POA: Diagnosis not present

## 2015-11-17 DIAGNOSIS — E782 Mixed hyperlipidemia: Secondary | ICD-10-CM | POA: Diagnosis not present

## 2015-11-17 DIAGNOSIS — E559 Vitamin D deficiency, unspecified: Secondary | ICD-10-CM | POA: Diagnosis not present

## 2015-11-17 DIAGNOSIS — N951 Menopausal and female climacteric states: Secondary | ICD-10-CM | POA: Diagnosis not present

## 2015-11-17 DIAGNOSIS — E039 Hypothyroidism, unspecified: Secondary | ICD-10-CM | POA: Diagnosis not present

## 2015-11-23 DIAGNOSIS — N951 Menopausal and female climacteric states: Secondary | ICD-10-CM | POA: Diagnosis not present

## 2015-12-09 DIAGNOSIS — K900 Celiac disease: Secondary | ICD-10-CM | POA: Diagnosis not present

## 2015-12-09 DIAGNOSIS — Z882 Allergy status to sulfonamides status: Secondary | ICD-10-CM | POA: Diagnosis not present

## 2015-12-09 DIAGNOSIS — K580 Irritable bowel syndrome with diarrhea: Secondary | ICD-10-CM | POA: Diagnosis not present

## 2015-12-09 DIAGNOSIS — Z880 Allergy status to penicillin: Secondary | ICD-10-CM | POA: Diagnosis not present

## 2015-12-09 DIAGNOSIS — R1013 Epigastric pain: Secondary | ICD-10-CM | POA: Diagnosis not present

## 2015-12-22 DIAGNOSIS — R1013 Epigastric pain: Secondary | ICD-10-CM | POA: Diagnosis not present

## 2015-12-22 DIAGNOSIS — K900 Celiac disease: Secondary | ICD-10-CM | POA: Diagnosis not present

## 2015-12-22 DIAGNOSIS — F419 Anxiety disorder, unspecified: Secondary | ICD-10-CM | POA: Diagnosis not present

## 2015-12-22 DIAGNOSIS — R109 Unspecified abdominal pain: Secondary | ICD-10-CM | POA: Diagnosis not present

## 2015-12-22 DIAGNOSIS — Z882 Allergy status to sulfonamides status: Secondary | ICD-10-CM | POA: Diagnosis not present

## 2015-12-22 DIAGNOSIS — Z880 Allergy status to penicillin: Secondary | ICD-10-CM | POA: Diagnosis not present

## 2015-12-22 DIAGNOSIS — G43909 Migraine, unspecified, not intractable, without status migrainosus: Secondary | ICD-10-CM | POA: Diagnosis not present

## 2015-12-22 DIAGNOSIS — Z90710 Acquired absence of both cervix and uterus: Secondary | ICD-10-CM | POA: Diagnosis not present

## 2015-12-22 DIAGNOSIS — K219 Gastro-esophageal reflux disease without esophagitis: Secondary | ICD-10-CM | POA: Diagnosis not present

## 2015-12-22 DIAGNOSIS — R197 Diarrhea, unspecified: Secondary | ICD-10-CM | POA: Diagnosis not present

## 2016-01-11 DIAGNOSIS — K219 Gastro-esophageal reflux disease without esophagitis: Secondary | ICD-10-CM | POA: Diagnosis not present

## 2016-01-11 DIAGNOSIS — Z79899 Other long term (current) drug therapy: Secondary | ICD-10-CM | POA: Diagnosis not present

## 2016-01-11 DIAGNOSIS — R197 Diarrhea, unspecified: Secondary | ICD-10-CM | POA: Diagnosis not present

## 2016-01-11 DIAGNOSIS — N959 Unspecified menopausal and perimenopausal disorder: Secondary | ICD-10-CM | POA: Diagnosis not present

## 2016-01-11 DIAGNOSIS — R1084 Generalized abdominal pain: Secondary | ICD-10-CM | POA: Diagnosis not present

## 2016-01-11 DIAGNOSIS — Z713 Dietary counseling and surveillance: Secondary | ICD-10-CM | POA: Diagnosis not present

## 2016-01-11 DIAGNOSIS — E079 Disorder of thyroid, unspecified: Secondary | ICD-10-CM | POA: Diagnosis not present

## 2016-01-11 DIAGNOSIS — K900 Celiac disease: Secondary | ICD-10-CM | POA: Diagnosis not present

## 2016-01-11 DIAGNOSIS — G8929 Other chronic pain: Secondary | ICD-10-CM | POA: Diagnosis not present

## 2016-01-14 DIAGNOSIS — K900 Celiac disease: Secondary | ICD-10-CM | POA: Diagnosis not present

## 2016-01-20 DIAGNOSIS — Z780 Asymptomatic menopausal state: Secondary | ICD-10-CM | POA: Diagnosis not present

## 2016-02-04 DIAGNOSIS — Z6821 Body mass index (BMI) 21.0-21.9, adult: Secondary | ICD-10-CM | POA: Diagnosis not present

## 2016-02-04 DIAGNOSIS — Z79899 Other long term (current) drug therapy: Secondary | ICD-10-CM | POA: Diagnosis not present

## 2016-02-04 DIAGNOSIS — K259 Gastric ulcer, unspecified as acute or chronic, without hemorrhage or perforation: Secondary | ICD-10-CM | POA: Diagnosis not present

## 2016-02-04 DIAGNOSIS — M810 Age-related osteoporosis without current pathological fracture: Secondary | ICD-10-CM | POA: Diagnosis not present

## 2016-02-11 DIAGNOSIS — G43909 Migraine, unspecified, not intractable, without status migrainosus: Secondary | ICD-10-CM | POA: Diagnosis not present

## 2016-02-11 DIAGNOSIS — K219 Gastro-esophageal reflux disease without esophagitis: Secondary | ICD-10-CM | POA: Diagnosis not present

## 2016-02-11 DIAGNOSIS — E039 Hypothyroidism, unspecified: Secondary | ICD-10-CM | POA: Diagnosis not present

## 2016-02-11 DIAGNOSIS — K900 Celiac disease: Secondary | ICD-10-CM | POA: Diagnosis not present

## 2016-02-11 DIAGNOSIS — Z79899 Other long term (current) drug therapy: Secondary | ICD-10-CM | POA: Diagnosis not present

## 2016-02-11 DIAGNOSIS — M797 Fibromyalgia: Secondary | ICD-10-CM | POA: Diagnosis not present

## 2016-02-11 DIAGNOSIS — R194 Change in bowel habit: Secondary | ICD-10-CM | POA: Diagnosis not present

## 2016-02-11 DIAGNOSIS — R1084 Generalized abdominal pain: Secondary | ICD-10-CM | POA: Diagnosis not present

## 2016-02-11 DIAGNOSIS — G8929 Other chronic pain: Secondary | ICD-10-CM | POA: Diagnosis not present

## 2016-02-11 DIAGNOSIS — Z1211 Encounter for screening for malignant neoplasm of colon: Secondary | ICD-10-CM | POA: Diagnosis not present

## 2016-02-11 DIAGNOSIS — K580 Irritable bowel syndrome with diarrhea: Secondary | ICD-10-CM | POA: Diagnosis not present

## 2016-02-11 DIAGNOSIS — K2270 Barrett's esophagus without dysplasia: Secondary | ICD-10-CM | POA: Diagnosis not present

## 2016-02-11 DIAGNOSIS — M810 Age-related osteoporosis without current pathological fracture: Secondary | ICD-10-CM | POA: Diagnosis not present

## 2016-02-16 DIAGNOSIS — L821 Other seborrheic keratosis: Secondary | ICD-10-CM | POA: Diagnosis not present

## 2016-02-16 DIAGNOSIS — L853 Xerosis cutis: Secondary | ICD-10-CM | POA: Diagnosis not present

## 2016-02-16 DIAGNOSIS — Z85828 Personal history of other malignant neoplasm of skin: Secondary | ICD-10-CM | POA: Diagnosis not present

## 2016-02-16 DIAGNOSIS — L814 Other melanin hyperpigmentation: Secondary | ICD-10-CM | POA: Diagnosis not present

## 2016-02-16 DIAGNOSIS — L570 Actinic keratosis: Secondary | ICD-10-CM | POA: Diagnosis not present

## 2016-02-25 DIAGNOSIS — M9903 Segmental and somatic dysfunction of lumbar region: Secondary | ICD-10-CM | POA: Diagnosis not present

## 2016-02-25 DIAGNOSIS — M5136 Other intervertebral disc degeneration, lumbar region: Secondary | ICD-10-CM | POA: Diagnosis not present

## 2016-02-25 DIAGNOSIS — M545 Low back pain: Secondary | ICD-10-CM | POA: Diagnosis not present

## 2016-02-25 DIAGNOSIS — M9913 Subluxation complex (vertebral) of lumbar region: Secondary | ICD-10-CM | POA: Diagnosis not present

## 2016-02-29 DIAGNOSIS — M5136 Other intervertebral disc degeneration, lumbar region: Secondary | ICD-10-CM | POA: Diagnosis not present

## 2016-02-29 DIAGNOSIS — M545 Low back pain: Secondary | ICD-10-CM | POA: Diagnosis not present

## 2016-02-29 DIAGNOSIS — M9903 Segmental and somatic dysfunction of lumbar region: Secondary | ICD-10-CM | POA: Diagnosis not present

## 2016-02-29 DIAGNOSIS — M9913 Subluxation complex (vertebral) of lumbar region: Secondary | ICD-10-CM | POA: Diagnosis not present

## 2016-03-08 DIAGNOSIS — M5136 Other intervertebral disc degeneration, lumbar region: Secondary | ICD-10-CM | POA: Diagnosis not present

## 2016-03-08 DIAGNOSIS — M9913 Subluxation complex (vertebral) of lumbar region: Secondary | ICD-10-CM | POA: Diagnosis not present

## 2016-03-08 DIAGNOSIS — M545 Low back pain: Secondary | ICD-10-CM | POA: Diagnosis not present

## 2016-03-08 DIAGNOSIS — M9903 Segmental and somatic dysfunction of lumbar region: Secondary | ICD-10-CM | POA: Diagnosis not present

## 2016-03-09 DIAGNOSIS — M810 Age-related osteoporosis without current pathological fracture: Secondary | ICD-10-CM | POA: Diagnosis not present

## 2016-03-09 DIAGNOSIS — Z6822 Body mass index (BMI) 22.0-22.9, adult: Secondary | ICD-10-CM | POA: Diagnosis not present

## 2016-03-09 DIAGNOSIS — Z79899 Other long term (current) drug therapy: Secondary | ICD-10-CM | POA: Diagnosis not present

## 2016-03-23 ENCOUNTER — Other Ambulatory Visit (HOSPITAL_COMMUNITY): Payer: Self-pay | Admitting: *Deleted

## 2016-03-24 ENCOUNTER — Ambulatory Visit (HOSPITAL_COMMUNITY)
Admission: RE | Admit: 2016-03-24 | Discharge: 2016-03-24 | Disposition: A | Discharge status: Home / Self Care | Payer: Medicare Other | Source: Ambulatory Visit | Attending: Internal Medicine | Admitting: Internal Medicine

## 2016-03-24 DIAGNOSIS — M810 Age-related osteoporosis without current pathological fracture: Secondary | ICD-10-CM | POA: Diagnosis not present

## 2016-03-24 MED ORDER — ZOLEDRONIC ACID 5 MG/100ML IV SOLN
5.0000 mg | Freq: Once | INTRAVENOUS | Status: AC
Start: 2016-03-24 — End: 2016-03-24
  Administered 2016-03-24: 5 mg via INTRAVENOUS

## 2016-03-24 MED ORDER — ZOLEDRONIC ACID 5 MG/100ML IV SOLN
INTRAVENOUS | Status: AC
  Filled 2016-03-24: qty 100

## 2016-03-24 NOTE — Discharge Instructions (Signed)

## 2016-04-13 DIAGNOSIS — K900 Celiac disease: Secondary | ICD-10-CM | POA: Diagnosis not present

## 2016-04-13 DIAGNOSIS — Z880 Allergy status to penicillin: Secondary | ICD-10-CM | POA: Diagnosis not present

## 2016-04-13 DIAGNOSIS — R7989 Other specified abnormal findings of blood chemistry: Secondary | ICD-10-CM | POA: Diagnosis not present

## 2016-04-13 DIAGNOSIS — K2270 Barrett's esophagus without dysplasia: Secondary | ICD-10-CM | POA: Diagnosis not present

## 2016-04-13 DIAGNOSIS — R1084 Generalized abdominal pain: Secondary | ICD-10-CM | POA: Diagnosis not present

## 2016-04-13 DIAGNOSIS — G8929 Other chronic pain: Secondary | ICD-10-CM | POA: Diagnosis not present

## 2016-04-13 DIAGNOSIS — Z79899 Other long term (current) drug therapy: Secondary | ICD-10-CM | POA: Diagnosis not present

## 2016-04-13 DIAGNOSIS — E559 Vitamin D deficiency, unspecified: Secondary | ICD-10-CM | POA: Diagnosis not present

## 2016-04-13 DIAGNOSIS — Z882 Allergy status to sulfonamides status: Secondary | ICD-10-CM | POA: Diagnosis not present

## 2016-04-13 DIAGNOSIS — K219 Gastro-esophageal reflux disease without esophagitis: Secondary | ICD-10-CM | POA: Diagnosis not present

## 2016-04-13 DIAGNOSIS — K580 Irritable bowel syndrome with diarrhea: Secondary | ICD-10-CM | POA: Diagnosis not present

## 2016-04-13 DIAGNOSIS — E782 Mixed hyperlipidemia: Secondary | ICD-10-CM | POA: Diagnosis not present

## 2016-04-13 DIAGNOSIS — E039 Hypothyroidism, unspecified: Secondary | ICD-10-CM | POA: Diagnosis not present

## 2016-04-21 DIAGNOSIS — N951 Menopausal and female climacteric states: Secondary | ICD-10-CM | POA: Diagnosis not present

## 2016-05-04 DIAGNOSIS — K130 Diseases of lips: Secondary | ICD-10-CM | POA: Diagnosis not present

## 2016-05-04 DIAGNOSIS — Z85828 Personal history of other malignant neoplasm of skin: Secondary | ICD-10-CM | POA: Diagnosis not present

## 2016-05-04 DIAGNOSIS — L570 Actinic keratosis: Secondary | ICD-10-CM | POA: Diagnosis not present

## 2016-05-16 DIAGNOSIS — K8681 Exocrine pancreatic insufficiency: Secondary | ICD-10-CM | POA: Diagnosis not present

## 2016-05-16 DIAGNOSIS — K900 Celiac disease: Secondary | ICD-10-CM | POA: Diagnosis not present

## 2016-05-16 DIAGNOSIS — K219 Gastro-esophageal reflux disease without esophagitis: Secondary | ICD-10-CM | POA: Diagnosis not present

## 2016-05-16 DIAGNOSIS — K589 Irritable bowel syndrome without diarrhea: Secondary | ICD-10-CM | POA: Diagnosis not present

## 2016-05-16 DIAGNOSIS — Z79899 Other long term (current) drug therapy: Secondary | ICD-10-CM | POA: Diagnosis not present

## 2016-05-16 DIAGNOSIS — Z880 Allergy status to penicillin: Secondary | ICD-10-CM | POA: Diagnosis not present

## 2016-05-16 DIAGNOSIS — Z882 Allergy status to sulfonamides status: Secondary | ICD-10-CM | POA: Diagnosis not present

## 2016-05-16 DIAGNOSIS — K2270 Barrett's esophagus without dysplasia: Secondary | ICD-10-CM | POA: Diagnosis not present

## 2016-05-18 IMAGING — MG MM SCREENING W/IMPLANTS
8 series · 8 of 8 positions shown · non-contrast
Comparison: Previous exam(s)

CLINICAL DATA: Screening.

EXAM:
DIGITAL SCREENING BILATERAL MAMMOGRAM WITH IMPLANTS AND CAD
The patient has retroglandular implants. Standard and implant
displaced views were performed.

[R CC]
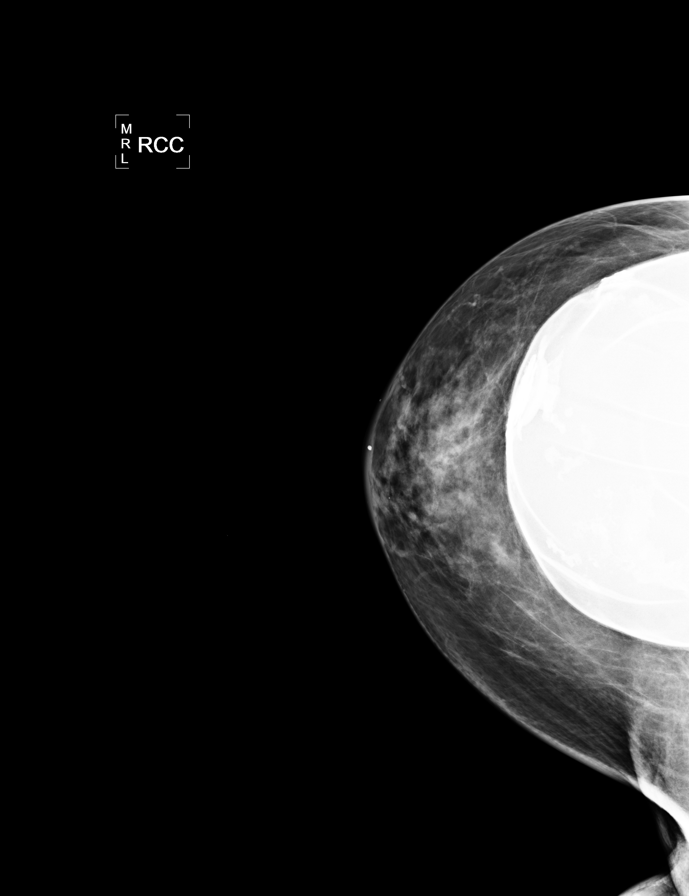

[L CC]
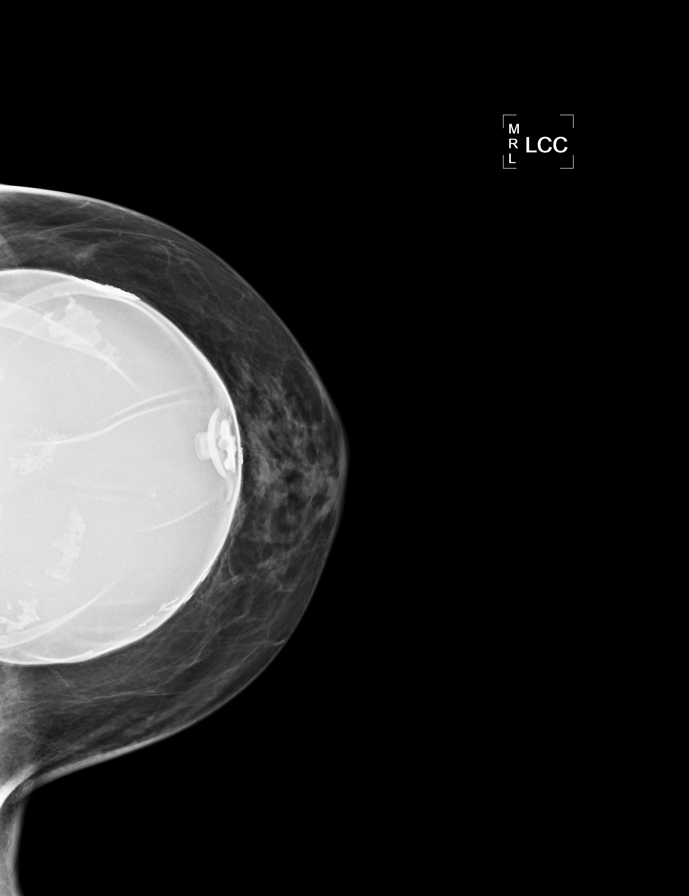

[L MLO]
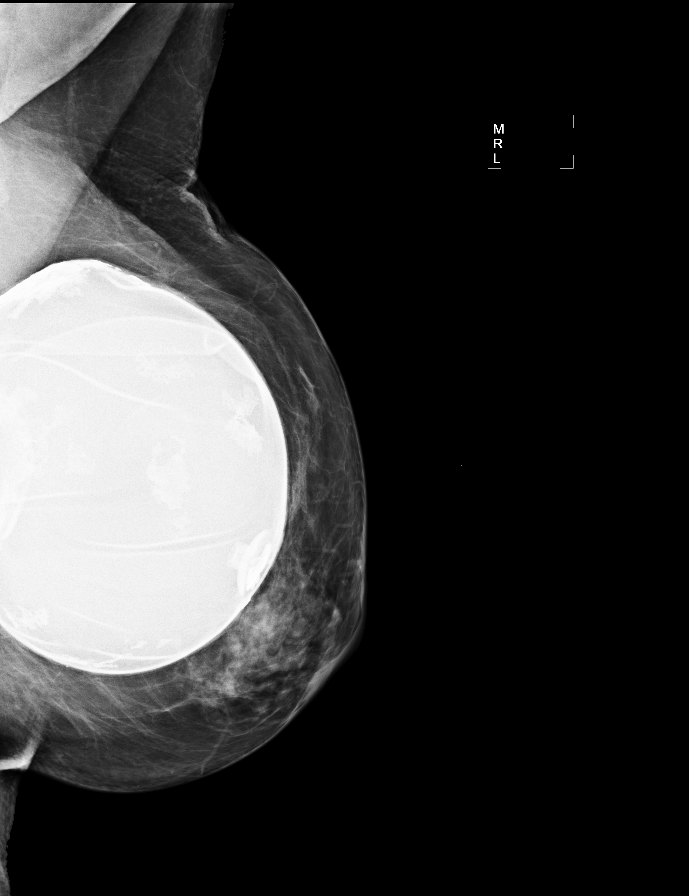

[R MLO]
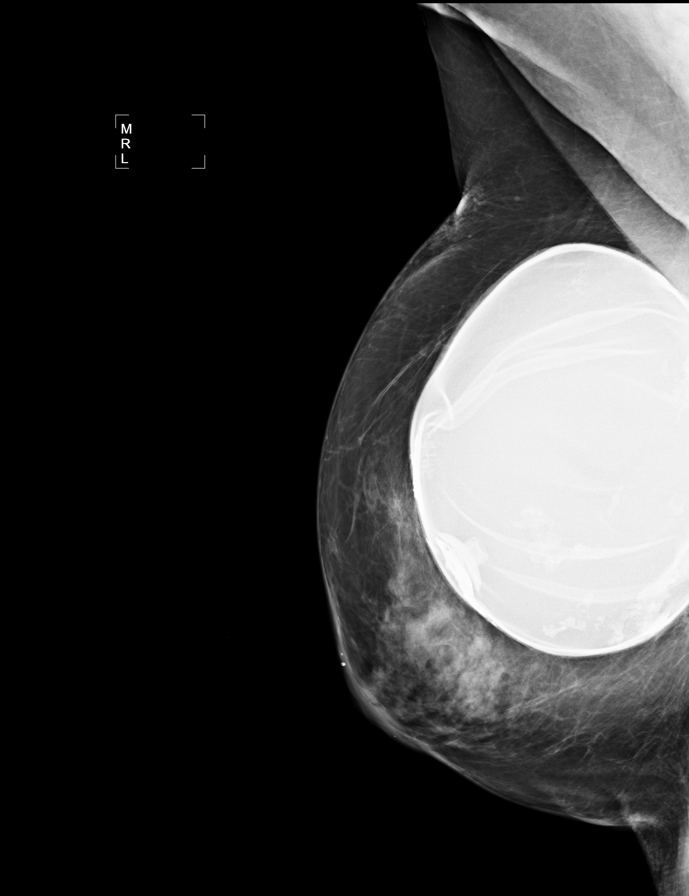

[R CCID]
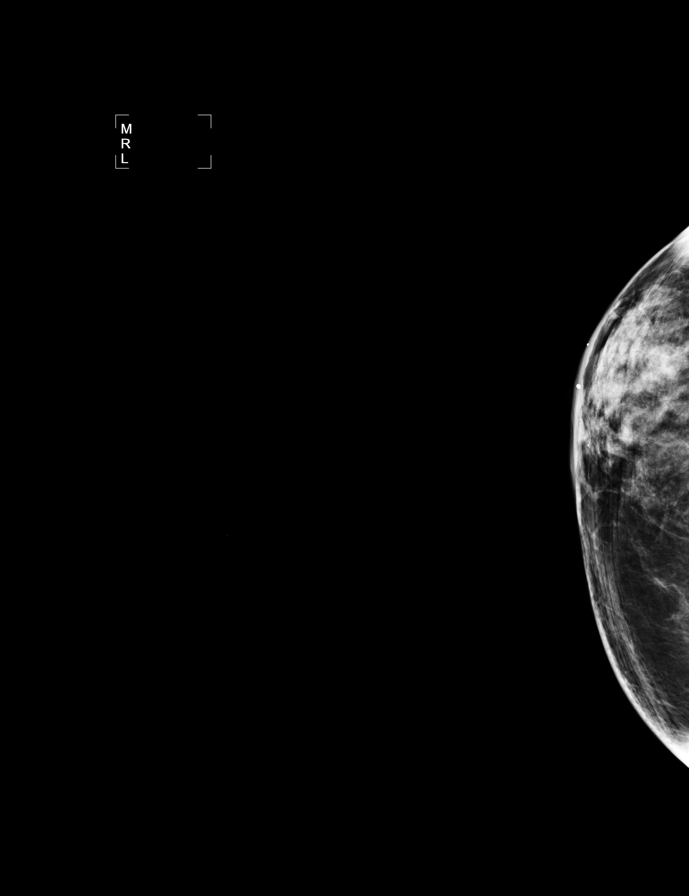

[L CCID]
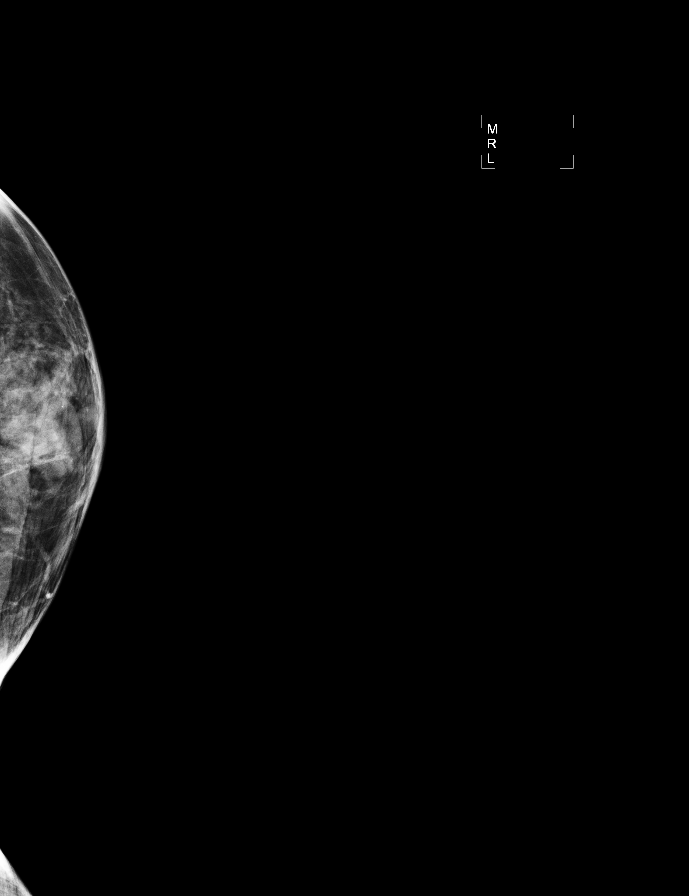

[L MLOID]
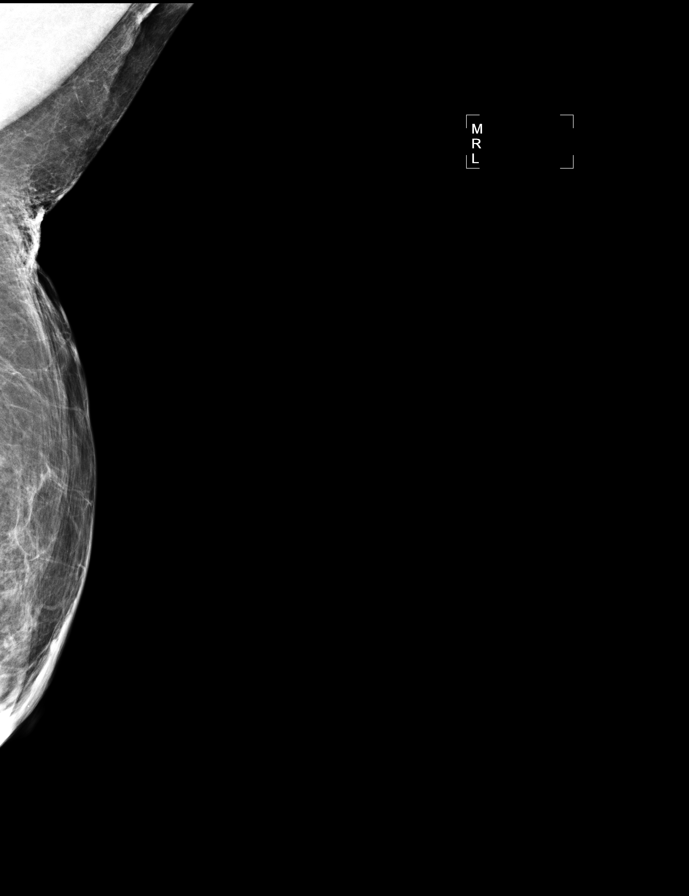

[R MLOID]
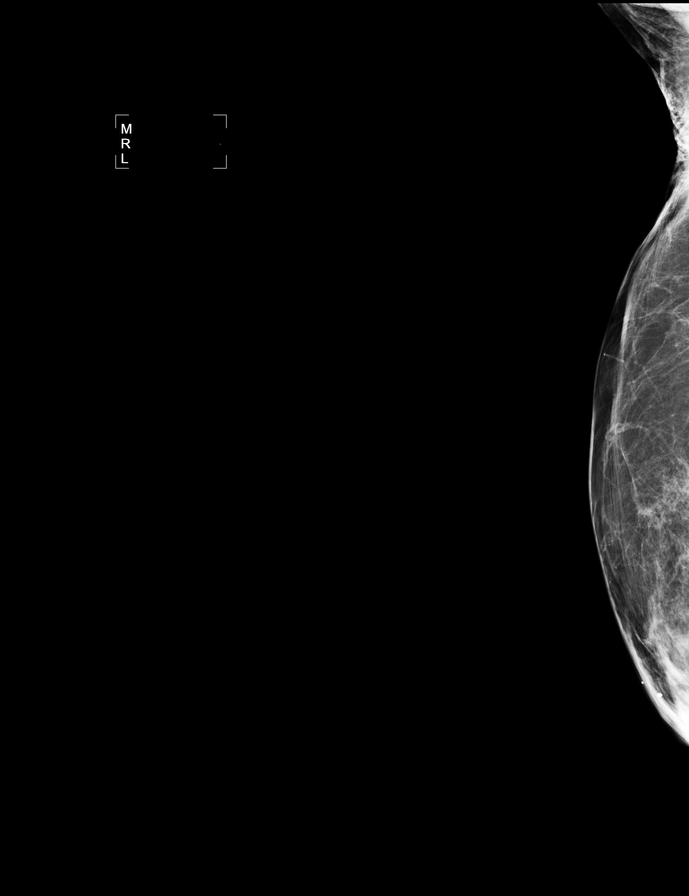

[8 of 8 positions shown; findings below may reference images not displayed]

ACR Breast Density Category c: The breast tissue is heterogeneously
dense, which may obscure small masses.
FINDINGS: There are no findings suspicious for malignancy. Images were
processed with CAD.
IMPRESSION: No mammographic evidence of malignancy. A result letter of this
screening mammogram will be mailed directly to the patient.

RECOMMENDATION:
Screening mammogram in one year. (Code:[id number])

BI-RADS CATEGORY  1:  Negative.

## 2016-06-22 DIAGNOSIS — M25562 Pain in left knee: Secondary | ICD-10-CM | POA: Diagnosis not present

## 2016-06-22 DIAGNOSIS — K900 Celiac disease: Secondary | ICD-10-CM | POA: Diagnosis not present

## 2016-06-22 DIAGNOSIS — M810 Age-related osteoporosis without current pathological fracture: Secondary | ICD-10-CM | POA: Diagnosis not present

## 2016-06-22 DIAGNOSIS — G43909 Migraine, unspecified, not intractable, without status migrainosus: Secondary | ICD-10-CM | POA: Diagnosis not present

## 2016-06-22 DIAGNOSIS — Z1389 Encounter for screening for other disorder: Secondary | ICD-10-CM | POA: Diagnosis not present

## 2016-06-22 DIAGNOSIS — E038 Other specified hypothyroidism: Secondary | ICD-10-CM | POA: Diagnosis not present

## 2016-06-22 DIAGNOSIS — M797 Fibromyalgia: Secondary | ICD-10-CM | POA: Diagnosis not present

## 2016-06-22 DIAGNOSIS — K219 Gastro-esophageal reflux disease without esophagitis: Secondary | ICD-10-CM | POA: Diagnosis not present

## 2016-06-22 DIAGNOSIS — E784 Other hyperlipidemia: Secondary | ICD-10-CM | POA: Diagnosis not present

## 2016-06-22 DIAGNOSIS — Z23 Encounter for immunization: Secondary | ICD-10-CM | POA: Diagnosis not present

## 2016-06-22 DIAGNOSIS — Z6823 Body mass index (BMI) 23.0-23.9, adult: Secondary | ICD-10-CM | POA: Diagnosis not present

## 2016-06-22 DIAGNOSIS — Z0000 Encounter for general adult medical examination without abnormal findings: Secondary | ICD-10-CM | POA: Diagnosis not present

## 2016-07-14 DIAGNOSIS — K580 Irritable bowel syndrome with diarrhea: Secondary | ICD-10-CM | POA: Diagnosis not present

## 2016-07-14 DIAGNOSIS — K2270 Barrett's esophagus without dysplasia: Secondary | ICD-10-CM | POA: Diagnosis not present

## 2016-07-14 DIAGNOSIS — G8929 Other chronic pain: Secondary | ICD-10-CM | POA: Diagnosis not present

## 2016-07-14 DIAGNOSIS — R1084 Generalized abdominal pain: Secondary | ICD-10-CM | POA: Diagnosis not present

## 2016-07-14 DIAGNOSIS — K219 Gastro-esophageal reflux disease without esophagitis: Secondary | ICD-10-CM | POA: Diagnosis not present

## 2016-07-14 DIAGNOSIS — Z79899 Other long term (current) drug therapy: Secondary | ICD-10-CM | POA: Diagnosis not present

## 2016-07-14 DIAGNOSIS — K900 Celiac disease: Secondary | ICD-10-CM | POA: Diagnosis not present

## 2016-07-14 DIAGNOSIS — Z880 Allergy status to penicillin: Secondary | ICD-10-CM | POA: Diagnosis not present

## 2016-07-14 DIAGNOSIS — Z882 Allergy status to sulfonamides status: Secondary | ICD-10-CM | POA: Diagnosis not present

## 2016-07-18 DIAGNOSIS — G8929 Other chronic pain: Secondary | ICD-10-CM | POA: Diagnosis not present

## 2016-07-18 DIAGNOSIS — R1084 Generalized abdominal pain: Secondary | ICD-10-CM | POA: Diagnosis not present

## 2016-07-20 DIAGNOSIS — E782 Mixed hyperlipidemia: Secondary | ICD-10-CM | POA: Diagnosis not present

## 2016-07-20 DIAGNOSIS — E039 Hypothyroidism, unspecified: Secondary | ICD-10-CM | POA: Diagnosis not present

## 2016-07-20 DIAGNOSIS — E559 Vitamin D deficiency, unspecified: Secondary | ICD-10-CM | POA: Diagnosis not present

## 2016-07-20 DIAGNOSIS — R7989 Other specified abnormal findings of blood chemistry: Secondary | ICD-10-CM | POA: Diagnosis not present

## 2016-07-27 DIAGNOSIS — E039 Hypothyroidism, unspecified: Secondary | ICD-10-CM | POA: Diagnosis not present

## 2016-08-10 DIAGNOSIS — Z85828 Personal history of other malignant neoplasm of skin: Secondary | ICD-10-CM | POA: Diagnosis not present

## 2016-08-10 DIAGNOSIS — K130 Diseases of lips: Secondary | ICD-10-CM | POA: Diagnosis not present

## 2016-08-10 DIAGNOSIS — C44629 Squamous cell carcinoma of skin of left upper limb, including shoulder: Secondary | ICD-10-CM | POA: Diagnosis not present

## 2016-08-10 DIAGNOSIS — D485 Neoplasm of uncertain behavior of skin: Secondary | ICD-10-CM | POA: Diagnosis not present

## 2016-08-17 ENCOUNTER — Encounter: Payer: Self-pay | Admitting: Podiatry

## 2016-08-17 ENCOUNTER — Ambulatory Visit (INDEPENDENT_AMBULATORY_CARE_PROVIDER_SITE_OTHER): Payer: Medicare Other | Admitting: Podiatry

## 2016-08-17 VITALS — Resp 14 | Wt 125.0 lb

## 2016-08-17 DIAGNOSIS — M79674 Pain in right toe(s): Secondary | ICD-10-CM

## 2016-08-17 DIAGNOSIS — L84 Corns and callosities: Secondary | ICD-10-CM | POA: Diagnosis not present

## 2016-08-17 DIAGNOSIS — B351 Tinea unguium: Secondary | ICD-10-CM

## 2016-08-17 NOTE — Progress Notes (Signed)
   Subjective:    Patient ID: Wanda Downs, female    DOB: 1941/09/11, 75 y.o.   MRN: [id number]  HPI this patient presents the office with chief complaint of painful long thick nails on her right foot and a painful fifth toenail on her left foot. Patient relates her second toenail right foot has come off but has regrown thick. He also has problems with her thick fourth toe, right foot. She also believes she has an ingrown nail on the fifth toe of the left foot. He presents the office today for an evaluation and treatment of her feet    Review of Systems  All other systems reviewed and are negative.      Objective:   Physical Exam GENERAL APPEARANCE: Alert, conversant. Appropriately groomed. No acute distress.  VASCULAR: Pedal pulses are  palpable at  [facility] and PT bilateral.  Capillary refill time is immediate to all digits,  Cold feet noted. NEUROLOGIC: sensation is normal to 5.07 monofilament at 5/5 sites bilateral.  Light touch is intact bilateral, Muscle strength normal.  MUSCULOSKELETAL: acceptable muscle strength, tone and stability bilateral.  Intrinsic muscluature intact bilateral.  Rectus appearance of foot and digits noted bilateral. HAV  B/L  Hammer toe fifth left.  DERMATOLOGIC: skin color, texture, and turgor are within normal limits.  No preulcerative lesions or ulcers  are seen, no interdigital maceration noted.  No open lesions present.  . No drainage noted.Listers corn fifth toe left foot.  NAILS  Thick disfigured discolored nails right foot.         Assessment & Plan:  Listers corn fifth toe left  Onychomycosis right foot.   IE  Debride corn.  Debride nails  B/L  RTC 3 months   Gardiner Barefoot DPM

## 2016-09-20 DIAGNOSIS — J069 Acute upper respiratory infection, unspecified: Secondary | ICD-10-CM | POA: Diagnosis not present

## 2016-10-07 DIAGNOSIS — K130 Diseases of lips: Secondary | ICD-10-CM | POA: Diagnosis not present

## 2016-10-07 DIAGNOSIS — Z85828 Personal history of other malignant neoplasm of skin: Secondary | ICD-10-CM | POA: Diagnosis not present

## 2016-10-20 DIAGNOSIS — K2270 Barrett's esophagus without dysplasia: Secondary | ICD-10-CM | POA: Diagnosis not present

## 2016-10-20 DIAGNOSIS — M797 Fibromyalgia: Secondary | ICD-10-CM | POA: Diagnosis not present

## 2016-10-20 DIAGNOSIS — Z880 Allergy status to penicillin: Secondary | ICD-10-CM | POA: Diagnosis not present

## 2016-10-20 DIAGNOSIS — Z882 Allergy status to sulfonamides status: Secondary | ICD-10-CM | POA: Diagnosis not present

## 2016-10-20 DIAGNOSIS — K580 Irritable bowel syndrome with diarrhea: Secondary | ICD-10-CM | POA: Diagnosis not present

## 2016-10-20 DIAGNOSIS — K900 Celiac disease: Secondary | ICD-10-CM | POA: Diagnosis not present

## 2016-10-20 DIAGNOSIS — Z8711 Personal history of peptic ulcer disease: Secondary | ICD-10-CM | POA: Diagnosis not present

## 2016-10-20 DIAGNOSIS — K219 Gastro-esophageal reflux disease without esophagitis: Secondary | ICD-10-CM | POA: Diagnosis not present

## 2016-10-20 DIAGNOSIS — Z79899 Other long term (current) drug therapy: Secondary | ICD-10-CM | POA: Diagnosis not present

## 2016-10-20 DIAGNOSIS — M791 Myalgia: Secondary | ICD-10-CM | POA: Diagnosis not present

## 2016-11-02 ENCOUNTER — Ambulatory Visit (INDEPENDENT_AMBULATORY_CARE_PROVIDER_SITE_OTHER): Payer: Medicare Other | Admitting: Podiatry

## 2016-11-02 VITALS — Resp 14 | Wt 125.0 lb

## 2016-11-02 DIAGNOSIS — L84 Corns and callosities: Secondary | ICD-10-CM

## 2016-11-02 DIAGNOSIS — B351 Tinea unguium: Secondary | ICD-10-CM | POA: Diagnosis not present

## 2016-11-02 DIAGNOSIS — M79674 Pain in right toe(s): Secondary | ICD-10-CM | POA: Diagnosis not present

## 2016-11-02 NOTE — Progress Notes (Signed)
   Subjective:    Patient ID: Wanda Downs, female    DOB: Aug 31, 1941, 76 y.o.   MRN: [id number]  HPI this patient presents the office with chief complaint of painful long thick nails on her right foot and a painful fifth toenail on her left foot. She  also has problems with her thick fourth toe, right.[name] He presents the office today for an evaluation and treatment of her feet    Review of Systems  All other systems reviewed and are negative.      Objective:   Physical Exam GENERAL APPEARANCE: Alert, conversant. Appropriately groomed. No acute distress.  VASCULAR: Pedal pulses are  palpable at  [facility] and PT bilateral.  Capillary refill time is immediate to all digits,  Cold feet noted. NEUROLOGIC: sensation is normal to 5.07 monofilament at 5/5 sites bilateral.  Light touch is intact bilateral, Muscle strength normal.  MUSCULOSKELETAL: acceptable muscle strength, tone and stability bilateral.  Intrinsic muscluature intact bilateral.  Rectus appearance of foot and digits noted bilateral. HAV  B/L  Hammer toe fifth left.  DERMATOLOGIC: skin color, texture, and turgor are within normal limits.  No preulcerative lesions or ulcers  are seen, no interdigital maceration noted.  No open lesions present.  . No drainage noted.Listers corn fifth toe left foot.  NAILS  Thick disfigured discolored nails right foot.         Assessment & Plan:  Listers corn fifth toe left  Onychomycosis right foot.   IE  Debride corn.  Debride nails  B/L  RTC 3 months   Gardiner Barefoot DPM

## 2016-11-15 DIAGNOSIS — K130 Diseases of lips: Secondary | ICD-10-CM | POA: Diagnosis not present

## 2016-11-15 DIAGNOSIS — Z85828 Personal history of other malignant neoplasm of skin: Secondary | ICD-10-CM | POA: Diagnosis not present

## 2016-11-16 DIAGNOSIS — J069 Acute upper respiratory infection, unspecified: Secondary | ICD-10-CM | POA: Diagnosis not present

## 2017-01-06 DIAGNOSIS — E782 Mixed hyperlipidemia: Secondary | ICD-10-CM | POA: Diagnosis not present

## 2017-01-06 DIAGNOSIS — E039 Hypothyroidism, unspecified: Secondary | ICD-10-CM | POA: Diagnosis not present

## 2017-01-06 DIAGNOSIS — R7989 Other specified abnormal findings of blood chemistry: Secondary | ICD-10-CM | POA: Diagnosis not present

## 2017-01-06 DIAGNOSIS — E559 Vitamin D deficiency, unspecified: Secondary | ICD-10-CM | POA: Diagnosis not present

## 2017-01-16 DIAGNOSIS — M791 Myalgia: Secondary | ICD-10-CM | POA: Diagnosis not present

## 2017-01-16 DIAGNOSIS — M19041 Primary osteoarthritis, right hand: Secondary | ICD-10-CM | POA: Diagnosis not present

## 2017-01-16 DIAGNOSIS — M810 Age-related osteoporosis without current pathological fracture: Secondary | ICD-10-CM | POA: Diagnosis not present

## 2017-01-16 DIAGNOSIS — M797 Fibromyalgia: Secondary | ICD-10-CM | POA: Diagnosis not present

## 2017-01-16 DIAGNOSIS — M19042 Primary osteoarthritis, left hand: Secondary | ICD-10-CM | POA: Diagnosis not present

## 2017-01-16 DIAGNOSIS — Z880 Allergy status to penicillin: Secondary | ICD-10-CM | POA: Diagnosis not present

## 2017-01-16 DIAGNOSIS — Z882 Allergy status to sulfonamides status: Secondary | ICD-10-CM | POA: Diagnosis not present

## 2017-01-16 DIAGNOSIS — F329 Major depressive disorder, single episode, unspecified: Secondary | ICD-10-CM | POA: Diagnosis not present

## 2017-01-23 DIAGNOSIS — N951 Menopausal and female climacteric states: Secondary | ICD-10-CM | POA: Diagnosis not present

## 2017-01-25 ENCOUNTER — Ambulatory Visit: Payer: Medicare Other | Admitting: Podiatry

## 2017-01-26 DIAGNOSIS — M9903 Segmental and somatic dysfunction of lumbar region: Secondary | ICD-10-CM | POA: Diagnosis not present

## 2017-01-26 DIAGNOSIS — M5136 Other intervertebral disc degeneration, lumbar region: Secondary | ICD-10-CM | POA: Diagnosis not present

## 2017-01-26 DIAGNOSIS — M545 Low back pain: Secondary | ICD-10-CM | POA: Diagnosis not present

## 2017-01-26 DIAGNOSIS — M9913 Subluxation complex (vertebral) of lumbar region: Secondary | ICD-10-CM | POA: Diagnosis not present

## 2017-01-30 DIAGNOSIS — M9903 Segmental and somatic dysfunction of lumbar region: Secondary | ICD-10-CM | POA: Diagnosis not present

## 2017-01-30 DIAGNOSIS — M5136 Other intervertebral disc degeneration, lumbar region: Secondary | ICD-10-CM | POA: Diagnosis not present

## 2017-01-30 DIAGNOSIS — M545 Low back pain: Secondary | ICD-10-CM | POA: Diagnosis not present

## 2017-01-30 DIAGNOSIS — M9913 Subluxation complex (vertebral) of lumbar region: Secondary | ICD-10-CM | POA: Diagnosis not present

## 2017-01-31 DIAGNOSIS — M545 Low back pain: Secondary | ICD-10-CM | POA: Diagnosis not present

## 2017-01-31 DIAGNOSIS — M9903 Segmental and somatic dysfunction of lumbar region: Secondary | ICD-10-CM | POA: Diagnosis not present

## 2017-01-31 DIAGNOSIS — M9913 Subluxation complex (vertebral) of lumbar region: Secondary | ICD-10-CM | POA: Diagnosis not present

## 2017-01-31 DIAGNOSIS — M5136 Other intervertebral disc degeneration, lumbar region: Secondary | ICD-10-CM | POA: Diagnosis not present

## 2017-02-01 DIAGNOSIS — M5136 Other intervertebral disc degeneration, lumbar region: Secondary | ICD-10-CM | POA: Diagnosis not present

## 2017-02-01 DIAGNOSIS — M9913 Subluxation complex (vertebral) of lumbar region: Secondary | ICD-10-CM | POA: Diagnosis not present

## 2017-02-01 DIAGNOSIS — M9903 Segmental and somatic dysfunction of lumbar region: Secondary | ICD-10-CM | POA: Diagnosis not present

## 2017-02-01 DIAGNOSIS — M545 Low back pain: Secondary | ICD-10-CM | POA: Diagnosis not present

## 2017-02-03 ENCOUNTER — Encounter: Payer: Self-pay | Admitting: Podiatry

## 2017-02-03 ENCOUNTER — Ambulatory Visit (INDEPENDENT_AMBULATORY_CARE_PROVIDER_SITE_OTHER): Payer: Medicare Other | Admitting: Podiatry

## 2017-02-03 DIAGNOSIS — B351 Tinea unguium: Secondary | ICD-10-CM | POA: Diagnosis not present

## 2017-02-03 DIAGNOSIS — Q828 Other specified congenital malformations of skin: Secondary | ICD-10-CM

## 2017-02-03 DIAGNOSIS — M79674 Pain in right toe(s): Secondary | ICD-10-CM

## 2017-02-03 NOTE — Progress Notes (Signed)
   Subjective:    Patient ID: Wanda Downs, female    DOB: 24-Sep-1941, 76 y.o.   MRN: [id number]  HPI this patient presents the office with chief complaint of painful long thick nails on her right foot and a painful fifth toenail on her left foot. She  Has painful callus under the ball of her left foot.[name] He presents the office today for an evaluation and treatment of her feet    Review of Systems  All other systems reviewed and are negative.      Objective:   Physical Exam GENERAL APPEARANCE: Alert, conversant. Appropriately groomed. No acute distress.  VASCULAR: Pedal pulses are  palpable at  [facility] and PT bilateral.  Capillary refill time is immediate to all digits,  Cold feet noted. NEUROLOGIC: sensation is normal to 5.07 monofilament at 5/5 sites bilateral.  Light touch is intact bilateral, Muscle strength normal.  MUSCULOSKELETAL: acceptable muscle strength, tone and stability bilateral.  Intrinsic muscluature intact bilateral.  Rectus appearance of foot and digits noted bilateral. HAV  B/L  Hammer toe fifth left.  DERMATOLOGIC: skin color, texture, and turgor are within normal limits.  No preulcerative lesions or ulcers  are seen, no interdigital maceration noted.  No open lesions present.  . No drainage noted.Listers corn fifth toe left foot. Porokeratosis sub 2 left foot.  NAILS  Thick disfigured discolored nails right foot.         Assessment & Plan:    Onychomycosis right foot.  Hammer toe 5th left  Porokeratosis sub 2 left foot.   Debride nails.  Debride porokeratosis left foot.  Padding was dispensed.  RTC  3 months.   Gardiner Barefoot DPM

## 2017-02-06 DIAGNOSIS — M9903 Segmental and somatic dysfunction of lumbar region: Secondary | ICD-10-CM | POA: Diagnosis not present

## 2017-02-06 DIAGNOSIS — M5136 Other intervertebral disc degeneration, lumbar region: Secondary | ICD-10-CM | POA: Diagnosis not present

## 2017-02-06 DIAGNOSIS — M9913 Subluxation complex (vertebral) of lumbar region: Secondary | ICD-10-CM | POA: Diagnosis not present

## 2017-02-06 DIAGNOSIS — M545 Low back pain: Secondary | ICD-10-CM | POA: Diagnosis not present

## 2017-02-07 DIAGNOSIS — M9913 Subluxation complex (vertebral) of lumbar region: Secondary | ICD-10-CM | POA: Diagnosis not present

## 2017-02-07 DIAGNOSIS — M9903 Segmental and somatic dysfunction of lumbar region: Secondary | ICD-10-CM | POA: Diagnosis not present

## 2017-02-07 DIAGNOSIS — M5136 Other intervertebral disc degeneration, lumbar region: Secondary | ICD-10-CM | POA: Diagnosis not present

## 2017-02-07 DIAGNOSIS — M545 Low back pain: Secondary | ICD-10-CM | POA: Diagnosis not present

## 2017-02-13 DIAGNOSIS — M9913 Subluxation complex (vertebral) of lumbar region: Secondary | ICD-10-CM | POA: Diagnosis not present

## 2017-02-13 DIAGNOSIS — M9903 Segmental and somatic dysfunction of lumbar region: Secondary | ICD-10-CM | POA: Diagnosis not present

## 2017-02-13 DIAGNOSIS — M5136 Other intervertebral disc degeneration, lumbar region: Secondary | ICD-10-CM | POA: Diagnosis not present

## 2017-02-13 DIAGNOSIS — M545 Low back pain: Secondary | ICD-10-CM | POA: Diagnosis not present

## 2017-02-22 DIAGNOSIS — K219 Gastro-esophageal reflux disease without esophagitis: Secondary | ICD-10-CM | POA: Diagnosis not present

## 2017-02-22 DIAGNOSIS — R109 Unspecified abdominal pain: Secondary | ICD-10-CM | POA: Diagnosis not present

## 2017-02-22 DIAGNOSIS — R3915 Urgency of urination: Secondary | ICD-10-CM | POA: Diagnosis not present

## 2017-02-22 DIAGNOSIS — K580 Irritable bowel syndrome with diarrhea: Secondary | ICD-10-CM | POA: Diagnosis not present

## 2017-02-22 DIAGNOSIS — M791 Myalgia: Secondary | ICD-10-CM | POA: Diagnosis not present

## 2017-02-22 DIAGNOSIS — K900 Celiac disease: Secondary | ICD-10-CM | POA: Diagnosis not present

## 2017-02-22 DIAGNOSIS — K2270 Barrett's esophagus without dysplasia: Secondary | ICD-10-CM | POA: Diagnosis not present

## 2017-02-23 DIAGNOSIS — F439 Reaction to severe stress, unspecified: Secondary | ICD-10-CM | POA: Diagnosis not present

## 2017-02-23 DIAGNOSIS — M797 Fibromyalgia: Secondary | ICD-10-CM | POA: Diagnosis not present

## 2017-02-23 DIAGNOSIS — K900 Celiac disease: Secondary | ICD-10-CM | POA: Diagnosis not present

## 2017-02-23 DIAGNOSIS — R1013 Epigastric pain: Secondary | ICD-10-CM | POA: Diagnosis not present

## 2017-02-27 DIAGNOSIS — M545 Low back pain: Secondary | ICD-10-CM | POA: Diagnosis not present

## 2017-02-27 DIAGNOSIS — M9903 Segmental and somatic dysfunction of lumbar region: Secondary | ICD-10-CM | POA: Diagnosis not present

## 2017-02-27 DIAGNOSIS — M5136 Other intervertebral disc degeneration, lumbar region: Secondary | ICD-10-CM | POA: Diagnosis not present

## 2017-02-27 DIAGNOSIS — M9913 Subluxation complex (vertebral) of lumbar region: Secondary | ICD-10-CM | POA: Diagnosis not present

## 2017-03-13 DIAGNOSIS — H0015 Chalazion left lower eyelid: Secondary | ICD-10-CM | POA: Diagnosis not present

## 2017-04-03 DIAGNOSIS — M9913 Subluxation complex (vertebral) of lumbar region: Secondary | ICD-10-CM | POA: Diagnosis not present

## 2017-04-03 DIAGNOSIS — M9903 Segmental and somatic dysfunction of lumbar region: Secondary | ICD-10-CM | POA: Diagnosis not present

## 2017-04-03 DIAGNOSIS — M545 Low back pain: Secondary | ICD-10-CM | POA: Diagnosis not present

## 2017-04-03 DIAGNOSIS — M5136 Other intervertebral disc degeneration, lumbar region: Secondary | ICD-10-CM | POA: Diagnosis not present

## 2017-04-25 DIAGNOSIS — H52203 Unspecified astigmatism, bilateral: Secondary | ICD-10-CM | POA: Diagnosis not present

## 2017-04-25 DIAGNOSIS — H40013 Open angle with borderline findings, low risk, bilateral: Secondary | ICD-10-CM | POA: Diagnosis not present

## 2017-04-25 DIAGNOSIS — H04123 Dry eye syndrome of bilateral lacrimal glands: Secondary | ICD-10-CM | POA: Diagnosis not present

## 2017-04-27 DIAGNOSIS — M545 Low back pain: Secondary | ICD-10-CM | POA: Diagnosis not present

## 2017-04-27 DIAGNOSIS — M5136 Other intervertebral disc degeneration, lumbar region: Secondary | ICD-10-CM | POA: Diagnosis not present

## 2017-04-27 DIAGNOSIS — M9903 Segmental and somatic dysfunction of lumbar region: Secondary | ICD-10-CM | POA: Diagnosis not present

## 2017-04-27 DIAGNOSIS — M9913 Subluxation complex (vertebral) of lumbar region: Secondary | ICD-10-CM | POA: Diagnosis not present

## 2017-05-02 DIAGNOSIS — M810 Age-related osteoporosis without current pathological fracture: Secondary | ICD-10-CM | POA: Diagnosis not present

## 2017-05-16 DIAGNOSIS — R5382 Chronic fatigue, unspecified: Secondary | ICD-10-CM | POA: Diagnosis not present

## 2017-05-16 DIAGNOSIS — K580 Irritable bowel syndrome with diarrhea: Secondary | ICD-10-CM | POA: Diagnosis not present

## 2017-05-16 DIAGNOSIS — M797 Fibromyalgia: Secondary | ICD-10-CM | POA: Diagnosis not present

## 2017-05-16 DIAGNOSIS — N3010 Interstitial cystitis (chronic) without hematuria: Secondary | ICD-10-CM | POA: Diagnosis not present

## 2017-05-29 DIAGNOSIS — M545 Low back pain: Secondary | ICD-10-CM | POA: Diagnosis not present

## 2017-05-29 DIAGNOSIS — M5136 Other intervertebral disc degeneration, lumbar region: Secondary | ICD-10-CM | POA: Diagnosis not present

## 2017-05-29 DIAGNOSIS — M9913 Subluxation complex (vertebral) of lumbar region: Secondary | ICD-10-CM | POA: Diagnosis not present

## 2017-05-29 DIAGNOSIS — M9903 Segmental and somatic dysfunction of lumbar region: Secondary | ICD-10-CM | POA: Diagnosis not present

## 2017-06-06 DIAGNOSIS — N3010 Interstitial cystitis (chronic) without hematuria: Secondary | ICD-10-CM | POA: Diagnosis not present

## 2017-06-06 DIAGNOSIS — M797 Fibromyalgia: Secondary | ICD-10-CM | POA: Diagnosis not present

## 2017-06-06 DIAGNOSIS — R3915 Urgency of urination: Secondary | ICD-10-CM | POA: Diagnosis not present

## 2017-06-07 ENCOUNTER — Ambulatory Visit: Payer: Medicare Other | Admitting: Podiatry

## 2017-06-07 DIAGNOSIS — M5136 Other intervertebral disc degeneration, lumbar region: Secondary | ICD-10-CM | POA: Diagnosis not present

## 2017-06-07 DIAGNOSIS — M545 Low back pain: Secondary | ICD-10-CM | POA: Diagnosis not present

## 2017-06-07 DIAGNOSIS — M9913 Subluxation complex (vertebral) of lumbar region: Secondary | ICD-10-CM | POA: Diagnosis not present

## 2017-06-07 DIAGNOSIS — M9903 Segmental and somatic dysfunction of lumbar region: Secondary | ICD-10-CM | POA: Diagnosis not present

## 2017-06-09 DIAGNOSIS — M9903 Segmental and somatic dysfunction of lumbar region: Secondary | ICD-10-CM | POA: Diagnosis not present

## 2017-06-09 DIAGNOSIS — M545 Low back pain: Secondary | ICD-10-CM | POA: Diagnosis not present

## 2017-06-09 DIAGNOSIS — M62830 Muscle spasm of back: Secondary | ICD-10-CM | POA: Diagnosis not present

## 2017-06-09 DIAGNOSIS — M5136 Other intervertebral disc degeneration, lumbar region: Secondary | ICD-10-CM | POA: Diagnosis not present

## 2017-06-15 DIAGNOSIS — M791 Myalgia: Secondary | ICD-10-CM | POA: Diagnosis not present

## 2017-06-15 DIAGNOSIS — K900 Celiac disease: Secondary | ICD-10-CM | POA: Diagnosis not present

## 2017-06-15 DIAGNOSIS — K580 Irritable bowel syndrome with diarrhea: Secondary | ICD-10-CM | POA: Diagnosis not present

## 2017-06-15 DIAGNOSIS — K2270 Barrett's esophagus without dysplasia: Secondary | ICD-10-CM | POA: Diagnosis not present

## 2017-06-15 DIAGNOSIS — K219 Gastro-esophageal reflux disease without esophagitis: Secondary | ICD-10-CM | POA: Diagnosis not present

## 2017-06-19 DIAGNOSIS — K210 Gastro-esophageal reflux disease with esophagitis: Secondary | ICD-10-CM | POA: Diagnosis not present

## 2017-06-19 DIAGNOSIS — K900 Celiac disease: Secondary | ICD-10-CM | POA: Diagnosis not present

## 2017-06-26 DIAGNOSIS — M810 Age-related osteoporosis without current pathological fracture: Secondary | ICD-10-CM | POA: Diagnosis not present

## 2017-06-26 DIAGNOSIS — E784 Other hyperlipidemia: Secondary | ICD-10-CM | POA: Diagnosis not present

## 2017-06-26 DIAGNOSIS — R8299 Other abnormal findings in urine: Secondary | ICD-10-CM | POA: Diagnosis not present

## 2017-06-26 DIAGNOSIS — Z862 Personal history of diseases of the blood and blood-forming organs and certain disorders involving the immune mechanism: Secondary | ICD-10-CM | POA: Diagnosis not present

## 2017-06-26 DIAGNOSIS — N390 Urinary tract infection, site not specified: Secondary | ICD-10-CM | POA: Diagnosis not present

## 2017-06-26 DIAGNOSIS — E038 Other specified hypothyroidism: Secondary | ICD-10-CM | POA: Diagnosis not present

## 2017-07-03 DIAGNOSIS — B379 Candidiasis, unspecified: Secondary | ICD-10-CM | POA: Diagnosis not present

## 2017-07-03 DIAGNOSIS — Z859 Personal history of malignant neoplasm, unspecified: Secondary | ICD-10-CM | POA: Diagnosis not present

## 2017-07-03 DIAGNOSIS — E038 Other specified hypothyroidism: Secondary | ICD-10-CM | POA: Diagnosis not present

## 2017-07-03 DIAGNOSIS — K529 Noninfective gastroenteritis and colitis, unspecified: Secondary | ICD-10-CM | POA: Diagnosis not present

## 2017-07-03 DIAGNOSIS — Z0000 Encounter for general adult medical examination without abnormal findings: Secondary | ICD-10-CM | POA: Diagnosis not present

## 2017-07-03 DIAGNOSIS — Z23 Encounter for immunization: Secondary | ICD-10-CM | POA: Diagnosis not present

## 2017-07-03 DIAGNOSIS — Z1389 Encounter for screening for other disorder: Secondary | ICD-10-CM | POA: Diagnosis not present

## 2017-07-03 DIAGNOSIS — E784 Other hyperlipidemia: Secondary | ICD-10-CM | POA: Diagnosis not present

## 2017-07-03 DIAGNOSIS — Z6826 Body mass index (BMI) 26.0-26.9, adult: Secondary | ICD-10-CM | POA: Diagnosis not present

## 2017-07-03 DIAGNOSIS — N390 Urinary tract infection, site not specified: Secondary | ICD-10-CM | POA: Diagnosis not present

## 2017-07-03 DIAGNOSIS — K219 Gastro-esophageal reflux disease without esophagitis: Secondary | ICD-10-CM | POA: Diagnosis not present

## 2017-07-03 DIAGNOSIS — M810 Age-related osteoporosis without current pathological fracture: Secondary | ICD-10-CM | POA: Diagnosis not present

## 2017-07-03 DIAGNOSIS — R350 Frequency of micturition: Secondary | ICD-10-CM | POA: Diagnosis not present

## 2017-07-12 ENCOUNTER — Encounter (HOSPITAL_COMMUNITY): Payer: Medicare Other

## 2017-07-12 DIAGNOSIS — Z1212 Encounter for screening for malignant neoplasm of rectum: Secondary | ICD-10-CM | POA: Diagnosis not present

## 2017-07-13 ENCOUNTER — Encounter (HOSPITAL_COMMUNITY): Payer: Medicare Other

## 2017-07-13 DIAGNOSIS — N3010 Interstitial cystitis (chronic) without hematuria: Secondary | ICD-10-CM | POA: Diagnosis not present

## 2017-07-20 DIAGNOSIS — E782 Mixed hyperlipidemia: Secondary | ICD-10-CM | POA: Diagnosis not present

## 2017-07-20 DIAGNOSIS — R7989 Other specified abnormal findings of blood chemistry: Secondary | ICD-10-CM | POA: Diagnosis not present

## 2017-07-20 DIAGNOSIS — E039 Hypothyroidism, unspecified: Secondary | ICD-10-CM | POA: Diagnosis not present

## 2017-07-20 DIAGNOSIS — E559 Vitamin D deficiency, unspecified: Secondary | ICD-10-CM | POA: Diagnosis not present

## 2017-07-21 ENCOUNTER — Ambulatory Visit (HOSPITAL_COMMUNITY)
Admission: RE | Admit: 2017-07-21 | Discharge: 2017-07-21 | Disposition: A | Discharge status: Home / Self Care | Payer: Medicare Other | Source: Ambulatory Visit | Attending: Internal Medicine | Admitting: Internal Medicine

## 2017-07-21 DIAGNOSIS — M810 Age-related osteoporosis without current pathological fracture: Secondary | ICD-10-CM | POA: Insufficient documentation

## 2017-07-21 MED ORDER — ZOLEDRONIC ACID 5 MG/100ML IV SOLN
INTRAVENOUS | Status: AC
  Administered 2017-07-21: 5 mg
  Filled 2017-07-21: qty 100

## 2017-07-21 MED ORDER — ZOLEDRONIC ACID 5 MG/100ML IV SOLN: 5.0000 mg | Freq: Once | INTRAVENOUS | Status: DC

## 2017-07-31 DIAGNOSIS — N951 Menopausal and female climacteric states: Secondary | ICD-10-CM | POA: Diagnosis not present

## 2017-10-24 DIAGNOSIS — M797 Fibromyalgia: Secondary | ICD-10-CM | POA: Diagnosis not present

## 2017-10-24 DIAGNOSIS — F439 Reaction to severe stress, unspecified: Secondary | ICD-10-CM | POA: Diagnosis not present

## 2017-10-24 DIAGNOSIS — R5383 Other fatigue: Secondary | ICD-10-CM | POA: Diagnosis not present

## 2017-12-21 DIAGNOSIS — K580 Irritable bowel syndrome with diarrhea: Secondary | ICD-10-CM | POA: Diagnosis not present

## 2017-12-21 DIAGNOSIS — R109 Unspecified abdominal pain: Secondary | ICD-10-CM | POA: Diagnosis not present

## 2017-12-21 DIAGNOSIS — K900 Celiac disease: Secondary | ICD-10-CM | POA: Diagnosis not present

## 2017-12-21 DIAGNOSIS — K2270 Barrett's esophagus without dysplasia: Secondary | ICD-10-CM | POA: Diagnosis not present

## 2017-12-21 DIAGNOSIS — K219 Gastro-esophageal reflux disease without esophagitis: Secondary | ICD-10-CM | POA: Diagnosis not present

## 2018-01-01 DIAGNOSIS — M26609 Unspecified temporomandibular joint disorder, unspecified side: Secondary | ICD-10-CM | POA: Diagnosis not present

## 2018-01-10 DIAGNOSIS — E559 Vitamin D deficiency, unspecified: Secondary | ICD-10-CM | POA: Diagnosis not present

## 2018-01-10 DIAGNOSIS — N951 Menopausal and female climacteric states: Secondary | ICD-10-CM | POA: Diagnosis not present

## 2018-01-10 DIAGNOSIS — R7989 Other specified abnormal findings of blood chemistry: Secondary | ICD-10-CM | POA: Diagnosis not present

## 2018-01-10 DIAGNOSIS — E782 Mixed hyperlipidemia: Secondary | ICD-10-CM | POA: Diagnosis not present

## 2018-01-10 DIAGNOSIS — E039 Hypothyroidism, unspecified: Secondary | ICD-10-CM | POA: Diagnosis not present

## 2018-01-17 DIAGNOSIS — N951 Menopausal and female climacteric states: Secondary | ICD-10-CM | POA: Diagnosis not present

## 2018-01-25 DIAGNOSIS — N951 Menopausal and female climacteric states: Secondary | ICD-10-CM | POA: Diagnosis not present

## 2018-02-06 ENCOUNTER — Other Ambulatory Visit: Payer: Self-pay | Admitting: Dentistry

## 2018-02-06 DIAGNOSIS — S0300XA Dislocation of jaw, unspecified side, initial encounter: Secondary | ICD-10-CM

## 2018-02-06 DIAGNOSIS — M26629 Arthralgia of temporomandibular joint, unspecified side: Secondary | ICD-10-CM

## 2018-02-06 DIAGNOSIS — R42 Dizziness and giddiness: Secondary | ICD-10-CM

## 2018-02-10 ENCOUNTER — Ambulatory Visit
Admission: RE | Admit: 2018-02-10 | Discharge: 2018-02-10 | Disposition: A | Discharge status: Home / Self Care | Payer: Medicare Other | Source: Ambulatory Visit | Attending: Dentistry | Admitting: Dentistry

## 2018-02-10 DIAGNOSIS — R42 Dizziness and giddiness: Secondary | ICD-10-CM

## 2018-02-10 DIAGNOSIS — M2669 Other specified disorders of temporomandibular joint: Secondary | ICD-10-CM | POA: Diagnosis not present

## 2018-02-10 DIAGNOSIS — S0300XA Dislocation of jaw, unspecified side, initial encounter: Secondary | ICD-10-CM

## 2018-02-10 DIAGNOSIS — M26629 Arthralgia of temporomandibular joint, unspecified side: Secondary | ICD-10-CM

## 2018-02-28 DIAGNOSIS — Z6823 Body mass index (BMI) 23.0-23.9, adult: Secondary | ICD-10-CM | POA: Diagnosis not present

## 2018-02-28 DIAGNOSIS — R05 Cough: Secondary | ICD-10-CM | POA: Diagnosis not present

## 2018-03-15 DIAGNOSIS — S80861S Insect bite (nonvenomous), right lower leg, sequela: Secondary | ICD-10-CM | POA: Diagnosis not present

## 2018-03-15 DIAGNOSIS — Z85828 Personal history of other malignant neoplasm of skin: Secondary | ICD-10-CM | POA: Diagnosis not present

## 2018-03-30 DIAGNOSIS — Z85828 Personal history of other malignant neoplasm of skin: Secondary | ICD-10-CM | POA: Diagnosis not present

## 2018-03-30 DIAGNOSIS — L812 Freckles: Secondary | ICD-10-CM | POA: Diagnosis not present

## 2018-03-30 DIAGNOSIS — L821 Other seborrheic keratosis: Secondary | ICD-10-CM | POA: Diagnosis not present

## 2018-03-30 DIAGNOSIS — L570 Actinic keratosis: Secondary | ICD-10-CM | POA: Diagnosis not present

## 2018-03-30 DIAGNOSIS — D1801 Hemangioma of skin and subcutaneous tissue: Secondary | ICD-10-CM | POA: Diagnosis not present

## 2018-06-13 DIAGNOSIS — Z882 Allergy status to sulfonamides status: Secondary | ICD-10-CM | POA: Diagnosis not present

## 2018-06-13 DIAGNOSIS — K900 Celiac disease: Secondary | ICD-10-CM | POA: Diagnosis not present

## 2018-06-13 DIAGNOSIS — K2270 Barrett's esophagus without dysplasia: Secondary | ICD-10-CM | POA: Diagnosis not present

## 2018-06-13 DIAGNOSIS — K210 Gastro-esophageal reflux disease with esophagitis: Secondary | ICD-10-CM | POA: Diagnosis not present

## 2018-06-13 DIAGNOSIS — K219 Gastro-esophageal reflux disease without esophagitis: Secondary | ICD-10-CM | POA: Diagnosis not present

## 2018-06-13 DIAGNOSIS — K580 Irritable bowel syndrome with diarrhea: Secondary | ICD-10-CM | POA: Diagnosis not present

## 2018-06-13 DIAGNOSIS — Z880 Allergy status to penicillin: Secondary | ICD-10-CM | POA: Diagnosis not present

## 2018-06-27 DIAGNOSIS — R82998 Other abnormal findings in urine: Secondary | ICD-10-CM | POA: Diagnosis not present

## 2018-06-27 DIAGNOSIS — Z862 Personal history of diseases of the blood and blood-forming organs and certain disorders involving the immune mechanism: Secondary | ICD-10-CM | POA: Diagnosis not present

## 2018-06-27 DIAGNOSIS — M810 Age-related osteoporosis without current pathological fracture: Secondary | ICD-10-CM | POA: Diagnosis not present

## 2018-06-27 DIAGNOSIS — E7849 Other hyperlipidemia: Secondary | ICD-10-CM | POA: Diagnosis not present

## 2018-06-27 DIAGNOSIS — E038 Other specified hypothyroidism: Secondary | ICD-10-CM | POA: Diagnosis not present

## 2018-07-04 DIAGNOSIS — E7849 Other hyperlipidemia: Secondary | ICD-10-CM | POA: Diagnosis not present

## 2018-07-04 DIAGNOSIS — M810 Age-related osteoporosis without current pathological fracture: Secondary | ICD-10-CM | POA: Diagnosis not present

## 2018-07-04 DIAGNOSIS — Z1389 Encounter for screening for other disorder: Secondary | ICD-10-CM | POA: Diagnosis not present

## 2018-07-04 DIAGNOSIS — Z23 Encounter for immunization: Secondary | ICD-10-CM | POA: Diagnosis not present

## 2018-07-04 DIAGNOSIS — E038 Other specified hypothyroidism: Secondary | ICD-10-CM | POA: Diagnosis not present

## 2018-07-04 DIAGNOSIS — K219 Gastro-esophageal reflux disease without esophagitis: Secondary | ICD-10-CM | POA: Diagnosis not present

## 2018-07-04 DIAGNOSIS — R358 Other polyuria: Secondary | ICD-10-CM | POA: Diagnosis not present

## 2018-07-04 DIAGNOSIS — Z0000 Encounter for general adult medical examination without abnormal findings: Secondary | ICD-10-CM | POA: Diagnosis not present

## 2018-07-04 DIAGNOSIS — W540XXA Bitten by dog, initial encounter: Secondary | ICD-10-CM | POA: Diagnosis not present

## 2018-07-04 DIAGNOSIS — M797 Fibromyalgia: Secondary | ICD-10-CM | POA: Diagnosis not present

## 2018-07-04 DIAGNOSIS — K900 Celiac disease: Secondary | ICD-10-CM | POA: Diagnosis not present

## 2018-07-04 DIAGNOSIS — Z859 Personal history of malignant neoplasm, unspecified: Secondary | ICD-10-CM | POA: Diagnosis not present

## 2018-07-04 DIAGNOSIS — Z6822 Body mass index (BMI) 22.0-22.9, adult: Secondary | ICD-10-CM | POA: Diagnosis not present

## 2018-07-05 DIAGNOSIS — Z1212 Encounter for screening for malignant neoplasm of rectum: Secondary | ICD-10-CM | POA: Diagnosis not present

## 2018-07-24 DIAGNOSIS — M545 Low back pain: Secondary | ICD-10-CM | POA: Diagnosis not present

## 2018-07-24 DIAGNOSIS — M9903 Segmental and somatic dysfunction of lumbar region: Secondary | ICD-10-CM | POA: Diagnosis not present

## 2018-07-24 DIAGNOSIS — M62830 Muscle spasm of back: Secondary | ICD-10-CM | POA: Diagnosis not present

## 2018-07-24 DIAGNOSIS — M5136 Other intervertebral disc degeneration, lumbar region: Secondary | ICD-10-CM | POA: Diagnosis not present

## 2018-07-25 DIAGNOSIS — N951 Menopausal and female climacteric states: Secondary | ICD-10-CM | POA: Diagnosis not present

## 2018-07-30 DIAGNOSIS — E039 Hypothyroidism, unspecified: Secondary | ICD-10-CM | POA: Diagnosis not present

## 2018-07-30 DIAGNOSIS — N951 Menopausal and female climacteric states: Secondary | ICD-10-CM | POA: Diagnosis not present

## 2018-07-30 DIAGNOSIS — E559 Vitamin D deficiency, unspecified: Secondary | ICD-10-CM | POA: Diagnosis not present

## 2018-07-30 DIAGNOSIS — R7989 Other specified abnormal findings of blood chemistry: Secondary | ICD-10-CM | POA: Diagnosis not present

## 2018-07-30 DIAGNOSIS — E782 Mixed hyperlipidemia: Secondary | ICD-10-CM | POA: Diagnosis not present

## 2018-08-08 DIAGNOSIS — N951 Menopausal and female climacteric states: Secondary | ICD-10-CM | POA: Diagnosis not present

## 2018-08-27 DIAGNOSIS — M9903 Segmental and somatic dysfunction of lumbar region: Secondary | ICD-10-CM | POA: Diagnosis not present

## 2018-08-27 DIAGNOSIS — M545 Low back pain: Secondary | ICD-10-CM | POA: Diagnosis not present

## 2018-08-27 DIAGNOSIS — M62830 Muscle spasm of back: Secondary | ICD-10-CM | POA: Diagnosis not present

## 2018-08-27 DIAGNOSIS — M5136 Other intervertebral disc degeneration, lumbar region: Secondary | ICD-10-CM | POA: Diagnosis not present

## 2018-09-17 DIAGNOSIS — L570 Actinic keratosis: Secondary | ICD-10-CM | POA: Diagnosis not present

## 2018-09-17 DIAGNOSIS — L812 Freckles: Secondary | ICD-10-CM | POA: Diagnosis not present

## 2018-09-17 DIAGNOSIS — C44619 Basal cell carcinoma of skin of left upper limb, including shoulder: Secondary | ICD-10-CM | POA: Diagnosis not present

## 2018-09-17 DIAGNOSIS — C4441 Basal cell carcinoma of skin of scalp and neck: Secondary | ICD-10-CM | POA: Diagnosis not present

## 2018-09-17 DIAGNOSIS — L821 Other seborrheic keratosis: Secondary | ICD-10-CM | POA: Diagnosis not present

## 2018-09-17 DIAGNOSIS — M545 Low back pain: Secondary | ICD-10-CM | POA: Diagnosis not present

## 2018-09-17 DIAGNOSIS — M5136 Other intervertebral disc degeneration, lumbar region: Secondary | ICD-10-CM | POA: Diagnosis not present

## 2018-09-17 DIAGNOSIS — Z85828 Personal history of other malignant neoplasm of skin: Secondary | ICD-10-CM | POA: Diagnosis not present

## 2018-09-17 DIAGNOSIS — M9903 Segmental and somatic dysfunction of lumbar region: Secondary | ICD-10-CM | POA: Diagnosis not present

## 2018-09-17 DIAGNOSIS — D485 Neoplasm of uncertain behavior of skin: Secondary | ICD-10-CM | POA: Diagnosis not present

## 2018-09-17 DIAGNOSIS — M62830 Muscle spasm of back: Secondary | ICD-10-CM | POA: Diagnosis not present

## 2018-09-19 DIAGNOSIS — H52203 Unspecified astigmatism, bilateral: Secondary | ICD-10-CM | POA: Diagnosis not present

## 2018-09-19 DIAGNOSIS — H43813 Vitreous degeneration, bilateral: Secondary | ICD-10-CM | POA: Diagnosis not present

## 2018-09-19 DIAGNOSIS — H02052 Trichiasis without entropian right lower eyelid: Secondary | ICD-10-CM | POA: Diagnosis not present

## 2018-09-19 DIAGNOSIS — H02055 Trichiasis without entropian left lower eyelid: Secondary | ICD-10-CM | POA: Diagnosis not present

## 2018-09-26 DIAGNOSIS — M9903 Segmental and somatic dysfunction of lumbar region: Secondary | ICD-10-CM | POA: Diagnosis not present

## 2018-09-26 DIAGNOSIS — M5136 Other intervertebral disc degeneration, lumbar region: Secondary | ICD-10-CM | POA: Diagnosis not present

## 2018-09-26 DIAGNOSIS — M545 Low back pain: Secondary | ICD-10-CM | POA: Diagnosis not present

## 2018-09-26 DIAGNOSIS — M62830 Muscle spasm of back: Secondary | ICD-10-CM | POA: Diagnosis not present

## 2018-09-28 DIAGNOSIS — N951 Menopausal and female climacteric states: Secondary | ICD-10-CM | POA: Diagnosis not present

## 2018-11-07 DIAGNOSIS — R05 Cough: Secondary | ICD-10-CM | POA: Diagnosis not present

## 2018-11-07 DIAGNOSIS — J111 Influenza due to unidentified influenza virus with other respiratory manifestations: Secondary | ICD-10-CM | POA: Diagnosis not present

## 2018-11-07 DIAGNOSIS — Z6822 Body mass index (BMI) 22.0-22.9, adult: Secondary | ICD-10-CM | POA: Diagnosis not present

## 2019-02-10 DIAGNOSIS — N951 Menopausal and female climacteric states: Secondary | ICD-10-CM | POA: Diagnosis not present

## 2019-02-13 DIAGNOSIS — N951 Menopausal and female climacteric states: Secondary | ICD-10-CM | POA: Diagnosis not present

## 2019-02-13 DIAGNOSIS — E559 Vitamin D deficiency, unspecified: Secondary | ICD-10-CM | POA: Diagnosis not present

## 2019-02-13 DIAGNOSIS — R7989 Other specified abnormal findings of blood chemistry: Secondary | ICD-10-CM | POA: Diagnosis not present

## 2019-02-13 DIAGNOSIS — E782 Mixed hyperlipidemia: Secondary | ICD-10-CM | POA: Diagnosis not present

## 2019-02-13 DIAGNOSIS — E039 Hypothyroidism, unspecified: Secondary | ICD-10-CM | POA: Diagnosis not present

## 2019-02-28 DIAGNOSIS — N951 Menopausal and female climacteric states: Secondary | ICD-10-CM | POA: Diagnosis not present

## 2019-03-15 DIAGNOSIS — K900 Celiac disease: Secondary | ICD-10-CM | POA: Diagnosis not present

## 2019-03-15 DIAGNOSIS — K210 Gastro-esophageal reflux disease with esophagitis: Secondary | ICD-10-CM | POA: Diagnosis not present

## 2019-03-15 DIAGNOSIS — K2270 Barrett's esophagus without dysplasia: Secondary | ICD-10-CM | POA: Diagnosis not present

## 2019-03-15 DIAGNOSIS — K580 Irritable bowel syndrome with diarrhea: Secondary | ICD-10-CM | POA: Diagnosis not present

## 2019-03-18 DIAGNOSIS — Z85828 Personal history of other malignant neoplasm of skin: Secondary | ICD-10-CM | POA: Diagnosis not present

## 2019-03-18 DIAGNOSIS — D485 Neoplasm of uncertain behavior of skin: Secondary | ICD-10-CM | POA: Diagnosis not present

## 2019-03-18 DIAGNOSIS — L570 Actinic keratosis: Secondary | ICD-10-CM | POA: Diagnosis not present

## 2019-03-18 DIAGNOSIS — C44722 Squamous cell carcinoma of skin of right lower limb, including hip: Secondary | ICD-10-CM | POA: Diagnosis not present

## 2019-03-18 DIAGNOSIS — L812 Freckles: Secondary | ICD-10-CM | POA: Diagnosis not present

## 2019-03-18 DIAGNOSIS — L821 Other seborrheic keratosis: Secondary | ICD-10-CM | POA: Diagnosis not present

## 2019-03-18 DIAGNOSIS — L578 Other skin changes due to chronic exposure to nonionizing radiation: Secondary | ICD-10-CM | POA: Diagnosis not present

## 2019-04-01 DIAGNOSIS — N951 Menopausal and female climacteric states: Secondary | ICD-10-CM | POA: Diagnosis not present

## 2019-04-24 DIAGNOSIS — M62830 Muscle spasm of back: Secondary | ICD-10-CM | POA: Diagnosis not present

## 2019-04-24 DIAGNOSIS — M9903 Segmental and somatic dysfunction of lumbar region: Secondary | ICD-10-CM | POA: Diagnosis not present

## 2019-04-24 DIAGNOSIS — M545 Low back pain: Secondary | ICD-10-CM | POA: Diagnosis not present

## 2019-04-24 DIAGNOSIS — M5136 Other intervertebral disc degeneration, lumbar region: Secondary | ICD-10-CM | POA: Diagnosis not present

## 2019-04-29 DIAGNOSIS — M545 Low back pain: Secondary | ICD-10-CM | POA: Diagnosis not present

## 2019-04-29 DIAGNOSIS — M9903 Segmental and somatic dysfunction of lumbar region: Secondary | ICD-10-CM | POA: Diagnosis not present

## 2019-04-29 DIAGNOSIS — M62830 Muscle spasm of back: Secondary | ICD-10-CM | POA: Diagnosis not present

## 2019-04-29 DIAGNOSIS — M5136 Other intervertebral disc degeneration, lumbar region: Secondary | ICD-10-CM | POA: Diagnosis not present

## 2019-05-06 DIAGNOSIS — M5136 Other intervertebral disc degeneration, lumbar region: Secondary | ICD-10-CM | POA: Diagnosis not present

## 2019-05-06 DIAGNOSIS — M9903 Segmental and somatic dysfunction of lumbar region: Secondary | ICD-10-CM | POA: Diagnosis not present

## 2019-05-06 DIAGNOSIS — M62830 Muscle spasm of back: Secondary | ICD-10-CM | POA: Diagnosis not present

## 2019-05-06 DIAGNOSIS — M545 Low back pain: Secondary | ICD-10-CM | POA: Diagnosis not present

## 2019-05-13 DIAGNOSIS — M9903 Segmental and somatic dysfunction of lumbar region: Secondary | ICD-10-CM | POA: Diagnosis not present

## 2019-05-13 DIAGNOSIS — M62830 Muscle spasm of back: Secondary | ICD-10-CM | POA: Diagnosis not present

## 2019-05-13 DIAGNOSIS — M545 Low back pain: Secondary | ICD-10-CM | POA: Diagnosis not present

## 2019-05-13 DIAGNOSIS — M5136 Other intervertebral disc degeneration, lumbar region: Secondary | ICD-10-CM | POA: Diagnosis not present

## 2019-05-24 ENCOUNTER — Other Ambulatory Visit: Payer: Self-pay | Admitting: Internal Medicine

## 2019-05-24 DIAGNOSIS — N644 Mastodynia: Secondary | ICD-10-CM | POA: Diagnosis not present

## 2019-05-24 DIAGNOSIS — Z9882 Breast implant status: Secondary | ICD-10-CM | POA: Diagnosis not present

## 2019-05-27 DIAGNOSIS — M62830 Muscle spasm of back: Secondary | ICD-10-CM | POA: Diagnosis not present

## 2019-05-27 DIAGNOSIS — M9903 Segmental and somatic dysfunction of lumbar region: Secondary | ICD-10-CM | POA: Diagnosis not present

## 2019-05-27 DIAGNOSIS — M545 Low back pain: Secondary | ICD-10-CM | POA: Diagnosis not present

## 2019-05-27 DIAGNOSIS — M5136 Other intervertebral disc degeneration, lumbar region: Secondary | ICD-10-CM | POA: Diagnosis not present

## 2019-05-31 ENCOUNTER — Ambulatory Visit: Payer: Medicare Other

## 2019-05-31 ENCOUNTER — Other Ambulatory Visit: Payer: Self-pay

## 2019-05-31 ENCOUNTER — Ambulatory Visit
Admission: RE | Admit: 2019-05-31 | Discharge: 2019-05-31 | Disposition: A | Discharge status: Home / Self Care | Payer: Medicare Other | Source: Ambulatory Visit | Attending: Internal Medicine | Admitting: Internal Medicine

## 2019-05-31 ENCOUNTER — Other Ambulatory Visit: Payer: Self-pay | Admitting: Internal Medicine

## 2019-05-31 DIAGNOSIS — N644 Mastodynia: Secondary | ICD-10-CM

## 2019-05-31 DIAGNOSIS — R922 Inconclusive mammogram: Secondary | ICD-10-CM | POA: Diagnosis not present

## 2019-06-10 DIAGNOSIS — M545 Low back pain: Secondary | ICD-10-CM | POA: Diagnosis not present

## 2019-06-10 DIAGNOSIS — M62830 Muscle spasm of back: Secondary | ICD-10-CM | POA: Diagnosis not present

## 2019-06-10 DIAGNOSIS — M9903 Segmental and somatic dysfunction of lumbar region: Secondary | ICD-10-CM | POA: Diagnosis not present

## 2019-06-10 DIAGNOSIS — M5136 Other intervertebral disc degeneration, lumbar region: Secondary | ICD-10-CM | POA: Diagnosis not present

## 2019-06-21 DIAGNOSIS — Z23 Encounter for immunization: Secondary | ICD-10-CM | POA: Diagnosis not present

## 2019-07-02 DIAGNOSIS — E038 Other specified hypothyroidism: Secondary | ICD-10-CM | POA: Diagnosis not present

## 2019-07-02 DIAGNOSIS — E7849 Other hyperlipidemia: Secondary | ICD-10-CM | POA: Diagnosis not present

## 2019-07-02 DIAGNOSIS — M810 Age-related osteoporosis without current pathological fracture: Secondary | ICD-10-CM | POA: Diagnosis not present

## 2019-07-03 DIAGNOSIS — R82998 Other abnormal findings in urine: Secondary | ICD-10-CM | POA: Diagnosis not present

## 2019-07-04 DIAGNOSIS — Z1212 Encounter for screening for malignant neoplasm of rectum: Secondary | ICD-10-CM | POA: Diagnosis not present

## 2019-07-09 DIAGNOSIS — R05 Cough: Secondary | ICD-10-CM | POA: Diagnosis not present

## 2019-07-09 DIAGNOSIS — K219 Gastro-esophageal reflux disease without esophagitis: Secondary | ICD-10-CM | POA: Diagnosis not present

## 2019-07-09 DIAGNOSIS — N644 Mastodynia: Secondary | ICD-10-CM | POA: Diagnosis not present

## 2019-07-09 DIAGNOSIS — E039 Hypothyroidism, unspecified: Secondary | ICD-10-CM | POA: Diagnosis not present

## 2019-07-09 DIAGNOSIS — E785 Hyperlipidemia, unspecified: Secondary | ICD-10-CM | POA: Diagnosis not present

## 2019-07-09 DIAGNOSIS — M797 Fibromyalgia: Secondary | ICD-10-CM | POA: Diagnosis not present

## 2019-07-09 DIAGNOSIS — Z0000 Encounter for general adult medical examination without abnormal findings: Secondary | ICD-10-CM | POA: Diagnosis not present

## 2019-07-09 DIAGNOSIS — M810 Age-related osteoporosis without current pathological fracture: Secondary | ICD-10-CM | POA: Diagnosis not present

## 2019-07-09 DIAGNOSIS — Z1331 Encounter for screening for depression: Secondary | ICD-10-CM | POA: Diagnosis not present

## 2019-07-12 DIAGNOSIS — K219 Gastro-esophageal reflux disease without esophagitis: Secondary | ICD-10-CM | POA: Diagnosis not present

## 2019-07-12 DIAGNOSIS — R109 Unspecified abdominal pain: Secondary | ICD-10-CM | POA: Diagnosis not present

## 2019-07-12 DIAGNOSIS — K900 Celiac disease: Secondary | ICD-10-CM | POA: Diagnosis not present

## 2019-07-12 DIAGNOSIS — K2270 Barrett's esophagus without dysplasia: Secondary | ICD-10-CM | POA: Diagnosis not present

## 2019-08-06 DIAGNOSIS — E039 Hypothyroidism, unspecified: Secondary | ICD-10-CM | POA: Diagnosis not present

## 2019-08-13 DIAGNOSIS — R7989 Other specified abnormal findings of blood chemistry: Secondary | ICD-10-CM | POA: Diagnosis not present

## 2019-08-13 DIAGNOSIS — E039 Hypothyroidism, unspecified: Secondary | ICD-10-CM | POA: Diagnosis not present

## 2019-08-13 DIAGNOSIS — N951 Menopausal and female climacteric states: Secondary | ICD-10-CM | POA: Diagnosis not present

## 2019-08-13 DIAGNOSIS — E559 Vitamin D deficiency, unspecified: Secondary | ICD-10-CM | POA: Diagnosis not present

## 2019-08-13 DIAGNOSIS — E782 Mixed hyperlipidemia: Secondary | ICD-10-CM | POA: Diagnosis not present

## 2019-08-19 DIAGNOSIS — E039 Hypothyroidism, unspecified: Secondary | ICD-10-CM | POA: Diagnosis not present

## 2019-09-12 DIAGNOSIS — L853 Xerosis cutis: Secondary | ICD-10-CM | POA: Diagnosis not present

## 2019-09-12 DIAGNOSIS — L814 Other melanin hyperpigmentation: Secondary | ICD-10-CM | POA: Diagnosis not present

## 2019-09-12 DIAGNOSIS — C44719 Basal cell carcinoma of skin of left lower limb, including hip: Secondary | ICD-10-CM | POA: Diagnosis not present

## 2019-09-12 DIAGNOSIS — Z85828 Personal history of other malignant neoplasm of skin: Secondary | ICD-10-CM | POA: Diagnosis not present

## 2019-09-12 DIAGNOSIS — K130 Diseases of lips: Secondary | ICD-10-CM | POA: Diagnosis not present

## 2019-09-12 DIAGNOSIS — L821 Other seborrheic keratosis: Secondary | ICD-10-CM | POA: Diagnosis not present

## 2019-09-12 DIAGNOSIS — D485 Neoplasm of uncertain behavior of skin: Secondary | ICD-10-CM | POA: Diagnosis not present

## 2019-10-17 DIAGNOSIS — D509 Iron deficiency anemia, unspecified: Secondary | ICD-10-CM | POA: Diagnosis not present

## 2019-11-11 ENCOUNTER — Other Ambulatory Visit: Payer: Self-pay | Admitting: Plastic Surgery

## 2019-12-01 ENCOUNTER — Ambulatory Visit: Payer: Medicare Other

## 2019-12-05 ENCOUNTER — Ambulatory Visit: Payer: Medicare Other | Attending: Internal Medicine

## 2019-12-05 DIAGNOSIS — Z23 Encounter for immunization: Secondary | ICD-10-CM | POA: Insufficient documentation

## 2019-12-05 NOTE — Progress Notes (Signed)
   Covid-19 Vaccination Clinic  Name:  Wanda Downs    MRN: [id number] DOB: Dec 29, 1940  12/05/2019  [name] was observed post Covid-19 immunization for 30 minutes based on pre-vaccination screening without incidence. She was provided with Vaccine Information Sheet and instruction to access the V-Safe system.   [name] was instructed to call 911 with any severe reactions post vaccine: [name] Difficulty breathing  . Swelling of your face and throat  . A fast heartbeat  . A bad rash all over your body  . Dizziness and weakness    Immunizations Administered    Name Date Dose VIS Date Route   Pfizer COVID-19 Vaccine 12/05/2019 10:05 AM 0.3 mL 09/20/2019 Intramuscular   Manufacturer: [place]   Lot: [id number]   [place]: [id number]

## 2019-12-12 DIAGNOSIS — R109 Unspecified abdominal pain: Secondary | ICD-10-CM | POA: Diagnosis not present

## 2019-12-12 DIAGNOSIS — K219 Gastro-esophageal reflux disease without esophagitis: Secondary | ICD-10-CM | POA: Diagnosis not present

## 2019-12-12 DIAGNOSIS — K2270 Barrett's esophagus without dysplasia: Secondary | ICD-10-CM | POA: Diagnosis not present

## 2019-12-12 DIAGNOSIS — K900 Celiac disease: Secondary | ICD-10-CM | POA: Diagnosis not present

## 2019-12-25 ENCOUNTER — Ambulatory Visit: Payer: Medicare Other | Attending: Internal Medicine

## 2019-12-25 DIAGNOSIS — Z23 Encounter for immunization: Secondary | ICD-10-CM

## 2019-12-25 NOTE — Progress Notes (Signed)
   Covid-19 Vaccination Clinic  Name:  Wanda Downs    MRN: [id number] DOB: 1941/04/26  12/25/2019  [name] was observed post Covid-19 immunization for 15 minutes without incident. She was provided with Vaccine Information Sheet and instruction to access the V-Safe system.   [name] was instructed to call 911 with any severe reactions post vaccine: [name] Difficulty breathing  . Swelling of face and throat  . A fast heartbeat  . A bad rash all over body  . Dizziness and weakness   Immunizations Administered    Name Date Dose VIS Date Route   Pfizer COVID-19 Vaccine 12/25/2019 12:17 PM 0.3 mL 09/20/2019 Intramuscular   Manufacturer: [place]   Lot: [id number]   [place]: [id number]

## 2020-01-01 DIAGNOSIS — Z961 Presence of intraocular lens: Secondary | ICD-10-CM | POA: Diagnosis not present

## 2020-01-01 DIAGNOSIS — H52203 Unspecified astigmatism, bilateral: Secondary | ICD-10-CM | POA: Diagnosis not present

## 2020-01-01 DIAGNOSIS — H524 Presbyopia: Secondary | ICD-10-CM | POA: Diagnosis not present

## 2020-01-01 DIAGNOSIS — H43813 Vitreous degeneration, bilateral: Secondary | ICD-10-CM | POA: Diagnosis not present

## 2020-01-13 DIAGNOSIS — N951 Menopausal and female climacteric states: Secondary | ICD-10-CM | POA: Diagnosis not present

## 2020-01-29 DIAGNOSIS — Z9189 Other specified personal risk factors, not elsewhere classified: Secondary | ICD-10-CM | POA: Diagnosis not present

## 2020-01-29 DIAGNOSIS — Z20822 Contact with and (suspected) exposure to covid-19: Secondary | ICD-10-CM | POA: Diagnosis not present

## 2020-02-05 DIAGNOSIS — K2270 Barrett's esophagus without dysplasia: Secondary | ICD-10-CM | POA: Diagnosis not present

## 2020-02-05 DIAGNOSIS — K219 Gastro-esophageal reflux disease without esophagitis: Secondary | ICD-10-CM | POA: Diagnosis not present

## 2020-02-05 DIAGNOSIS — K208 Other esophagitis without bleeding: Secondary | ICD-10-CM | POA: Diagnosis not present

## 2020-02-05 DIAGNOSIS — K449 Diaphragmatic hernia without obstruction or gangrene: Secondary | ICD-10-CM | POA: Diagnosis not present

## 2020-02-10 DIAGNOSIS — N951 Menopausal and female climacteric states: Secondary | ICD-10-CM | POA: Diagnosis not present

## 2020-03-02 DIAGNOSIS — E782 Mixed hyperlipidemia: Secondary | ICD-10-CM | POA: Diagnosis not present

## 2020-03-02 DIAGNOSIS — N951 Menopausal and female climacteric states: Secondary | ICD-10-CM | POA: Diagnosis not present

## 2020-03-02 DIAGNOSIS — E559 Vitamin D deficiency, unspecified: Secondary | ICD-10-CM | POA: Diagnosis not present

## 2020-03-02 DIAGNOSIS — R7989 Other specified abnormal findings of blood chemistry: Secondary | ICD-10-CM | POA: Diagnosis not present

## 2020-03-02 DIAGNOSIS — E039 Hypothyroidism, unspecified: Secondary | ICD-10-CM | POA: Diagnosis not present

## 2020-03-10 DIAGNOSIS — N951 Menopausal and female climacteric states: Secondary | ICD-10-CM | POA: Diagnosis not present

## 2020-03-12 DIAGNOSIS — Z85828 Personal history of other malignant neoplasm of skin: Secondary | ICD-10-CM | POA: Diagnosis not present

## 2020-03-12 DIAGNOSIS — L814 Other melanin hyperpigmentation: Secondary | ICD-10-CM | POA: Diagnosis not present

## 2020-03-12 DIAGNOSIS — L812 Freckles: Secondary | ICD-10-CM | POA: Diagnosis not present

## 2020-03-12 DIAGNOSIS — L570 Actinic keratosis: Secondary | ICD-10-CM | POA: Diagnosis not present

## 2020-03-12 DIAGNOSIS — L821 Other seborrheic keratosis: Secondary | ICD-10-CM | POA: Diagnosis not present

## 2020-03-12 DIAGNOSIS — K130 Diseases of lips: Secondary | ICD-10-CM | POA: Diagnosis not present

## 2020-04-27 DIAGNOSIS — N951 Menopausal and female climacteric states: Secondary | ICD-10-CM | POA: Diagnosis not present

## 2020-05-14 DIAGNOSIS — K2270 Barrett's esophagus without dysplasia: Secondary | ICD-10-CM | POA: Diagnosis not present

## 2020-05-14 DIAGNOSIS — K900 Celiac disease: Secondary | ICD-10-CM | POA: Diagnosis not present

## 2020-05-14 DIAGNOSIS — K219 Gastro-esophageal reflux disease without esophagitis: Secondary | ICD-10-CM | POA: Diagnosis not present

## 2020-05-29 DIAGNOSIS — H04223 Epiphora due to insufficient drainage, bilateral lacrimal glands: Secondary | ICD-10-CM | POA: Diagnosis not present

## 2020-05-29 DIAGNOSIS — H02052 Trichiasis without entropian right lower eyelid: Secondary | ICD-10-CM | POA: Diagnosis not present

## 2020-06-17 DIAGNOSIS — N951 Menopausal and female climacteric states: Secondary | ICD-10-CM | POA: Diagnosis not present

## 2020-07-03 DIAGNOSIS — Q104 Absence and agenesis of lacrimal apparatus: Secondary | ICD-10-CM | POA: Diagnosis not present

## 2020-07-03 DIAGNOSIS — H04543 Stenosis of bilateral lacrimal canaliculi: Secondary | ICD-10-CM | POA: Diagnosis not present

## 2020-07-03 DIAGNOSIS — H57813 Brow ptosis, bilateral: Secondary | ICD-10-CM | POA: Diagnosis not present

## 2020-07-03 DIAGNOSIS — H02834 Dermatochalasis of left upper eyelid: Secondary | ICD-10-CM | POA: Diagnosis not present

## 2020-07-03 DIAGNOSIS — H02831 Dermatochalasis of right upper eyelid: Secondary | ICD-10-CM | POA: Diagnosis not present

## 2020-07-03 DIAGNOSIS — H04223 Epiphora due to insufficient drainage, bilateral lacrimal glands: Secondary | ICD-10-CM | POA: Diagnosis not present

## 2020-07-03 DIAGNOSIS — H04553 Acquired stenosis of bilateral nasolacrimal duct: Secondary | ICD-10-CM | POA: Diagnosis not present

## 2020-07-05 DIAGNOSIS — N951 Menopausal and female climacteric states: Secondary | ICD-10-CM | POA: Diagnosis not present

## 2020-07-09 DIAGNOSIS — Z23 Encounter for immunization: Secondary | ICD-10-CM | POA: Diagnosis not present

## 2020-07-17 DIAGNOSIS — E785 Hyperlipidemia, unspecified: Secondary | ICD-10-CM | POA: Diagnosis not present

## 2020-07-17 DIAGNOSIS — M810 Age-related osteoporosis without current pathological fracture: Secondary | ICD-10-CM | POA: Diagnosis not present

## 2020-07-17 DIAGNOSIS — E039 Hypothyroidism, unspecified: Secondary | ICD-10-CM | POA: Diagnosis not present

## 2020-07-20 DIAGNOSIS — R8290 Unspecified abnormal findings in urine: Secondary | ICD-10-CM | POA: Diagnosis not present

## 2020-07-20 DIAGNOSIS — K219 Gastro-esophageal reflux disease without esophagitis: Secondary | ICD-10-CM | POA: Diagnosis not present

## 2020-07-20 DIAGNOSIS — R059 Cough, unspecified: Secondary | ICD-10-CM | POA: Diagnosis not present

## 2020-07-20 DIAGNOSIS — E785 Hyperlipidemia, unspecified: Secondary | ICD-10-CM | POA: Diagnosis not present

## 2020-07-20 DIAGNOSIS — M797 Fibromyalgia: Secondary | ICD-10-CM | POA: Diagnosis not present

## 2020-07-20 DIAGNOSIS — R5383 Other fatigue: Secondary | ICD-10-CM | POA: Diagnosis not present

## 2020-07-20 DIAGNOSIS — E039 Hypothyroidism, unspecified: Secondary | ICD-10-CM | POA: Diagnosis not present

## 2020-07-20 DIAGNOSIS — Z0000 Encounter for general adult medical examination without abnormal findings: Secondary | ICD-10-CM | POA: Diagnosis not present

## 2020-07-20 DIAGNOSIS — M810 Age-related osteoporosis without current pathological fracture: Secondary | ICD-10-CM | POA: Diagnosis not present

## 2020-07-29 DIAGNOSIS — H57813 Brow ptosis, bilateral: Secondary | ICD-10-CM | POA: Diagnosis not present

## 2020-07-29 DIAGNOSIS — H04543 Stenosis of bilateral lacrimal canaliculi: Secondary | ICD-10-CM | POA: Diagnosis not present

## 2020-07-29 DIAGNOSIS — H04553 Acquired stenosis of bilateral nasolacrimal duct: Secondary | ICD-10-CM | POA: Diagnosis not present

## 2020-07-29 DIAGNOSIS — H02834 Dermatochalasis of left upper eyelid: Secondary | ICD-10-CM | POA: Diagnosis not present

## 2020-07-29 DIAGNOSIS — Q104 Absence and agenesis of lacrimal apparatus: Secondary | ICD-10-CM | POA: Diagnosis not present

## 2020-07-29 DIAGNOSIS — H02831 Dermatochalasis of right upper eyelid: Secondary | ICD-10-CM | POA: Diagnosis not present

## 2020-07-29 DIAGNOSIS — H04223 Epiphora due to insufficient drainage, bilateral lacrimal glands: Secondary | ICD-10-CM | POA: Diagnosis not present

## 2020-07-30 DIAGNOSIS — Z1212 Encounter for screening for malignant neoplasm of rectum: Secondary | ICD-10-CM | POA: Diagnosis not present

## 2020-08-01 DIAGNOSIS — Z23 Encounter for immunization: Secondary | ICD-10-CM | POA: Diagnosis not present

## 2020-08-02 DIAGNOSIS — N951 Menopausal and female climacteric states: Secondary | ICD-10-CM | POA: Diagnosis not present

## 2020-08-14 DIAGNOSIS — Z01818 Encounter for other preprocedural examination: Secondary | ICD-10-CM | POA: Diagnosis not present

## 2020-08-18 ENCOUNTER — Other Ambulatory Visit: Payer: Self-pay | Admitting: Ophthalmology

## 2020-08-18 DIAGNOSIS — H04223 Epiphora due to insufficient drainage, bilateral lacrimal glands: Secondary | ICD-10-CM | POA: Diagnosis not present

## 2020-08-18 DIAGNOSIS — H57813 Brow ptosis, bilateral: Secondary | ICD-10-CM | POA: Diagnosis not present

## 2020-08-18 DIAGNOSIS — H02831 Dermatochalasis of right upper eyelid: Secondary | ICD-10-CM | POA: Diagnosis not present

## 2020-08-18 DIAGNOSIS — Q104 Absence and agenesis of lacrimal apparatus: Secondary | ICD-10-CM | POA: Diagnosis not present

## 2020-08-18 DIAGNOSIS — H04551 Acquired stenosis of right nasolacrimal duct: Secondary | ICD-10-CM | POA: Diagnosis not present

## 2020-08-18 DIAGNOSIS — H04411 Chronic dacryocystitis of right lacrimal passage: Secondary | ICD-10-CM | POA: Diagnosis not present

## 2020-08-18 DIAGNOSIS — H02834 Dermatochalasis of left upper eyelid: Secondary | ICD-10-CM | POA: Diagnosis not present

## 2020-08-18 DIAGNOSIS — H04553 Acquired stenosis of bilateral nasolacrimal duct: Secondary | ICD-10-CM | POA: Diagnosis not present

## 2020-08-18 DIAGNOSIS — H04543 Stenosis of bilateral lacrimal canaliculi: Secondary | ICD-10-CM | POA: Diagnosis not present

## 2020-08-28 DIAGNOSIS — Z09 Encounter for follow-up examination after completed treatment for conditions other than malignant neoplasm: Secondary | ICD-10-CM | POA: Diagnosis not present

## 2020-08-28 DIAGNOSIS — H04553 Acquired stenosis of bilateral nasolacrimal duct: Secondary | ICD-10-CM | POA: Diagnosis not present

## 2020-08-28 DIAGNOSIS — H04223 Epiphora due to insufficient drainage, bilateral lacrimal glands: Secondary | ICD-10-CM | POA: Diagnosis not present

## 2020-08-31 DIAGNOSIS — Z09 Encounter for follow-up examination after completed treatment for conditions other than malignant neoplasm: Secondary | ICD-10-CM | POA: Diagnosis not present

## 2020-08-31 DIAGNOSIS — H04553 Acquired stenosis of bilateral nasolacrimal duct: Secondary | ICD-10-CM | POA: Diagnosis not present

## 2020-08-31 DIAGNOSIS — H04223 Epiphora due to insufficient drainage, bilateral lacrimal glands: Secondary | ICD-10-CM | POA: Diagnosis not present

## 2020-08-31 DIAGNOSIS — H04551 Acquired stenosis of right nasolacrimal duct: Secondary | ICD-10-CM | POA: Diagnosis not present

## 2020-09-10 DIAGNOSIS — K900 Celiac disease: Secondary | ICD-10-CM | POA: Diagnosis not present

## 2020-09-10 DIAGNOSIS — K219 Gastro-esophageal reflux disease without esophagitis: Secondary | ICD-10-CM | POA: Diagnosis not present

## 2020-09-10 DIAGNOSIS — R109 Unspecified abdominal pain: Secondary | ICD-10-CM | POA: Diagnosis not present

## 2020-09-10 DIAGNOSIS — K2270 Barrett's esophagus without dysplasia: Secondary | ICD-10-CM | POA: Diagnosis not present

## 2020-09-14 DIAGNOSIS — L821 Other seborrheic keratosis: Secondary | ICD-10-CM | POA: Diagnosis not present

## 2020-09-14 DIAGNOSIS — Z85828 Personal history of other malignant neoplasm of skin: Secondary | ICD-10-CM | POA: Diagnosis not present

## 2020-09-14 DIAGNOSIS — L570 Actinic keratosis: Secondary | ICD-10-CM | POA: Diagnosis not present

## 2020-09-14 DIAGNOSIS — D485 Neoplasm of uncertain behavior of skin: Secondary | ICD-10-CM | POA: Diagnosis not present

## 2020-09-14 DIAGNOSIS — C44719 Basal cell carcinoma of skin of left lower limb, including hip: Secondary | ICD-10-CM | POA: Diagnosis not present

## 2020-09-14 DIAGNOSIS — C44729 Squamous cell carcinoma of skin of left lower limb, including hip: Secondary | ICD-10-CM | POA: Diagnosis not present

## 2020-09-14 DIAGNOSIS — L812 Freckles: Secondary | ICD-10-CM | POA: Diagnosis not present

## 2020-09-14 DIAGNOSIS — L218 Other seborrheic dermatitis: Secondary | ICD-10-CM | POA: Diagnosis not present

## 2020-09-15 DIAGNOSIS — N951 Menopausal and female climacteric states: Secondary | ICD-10-CM | POA: Diagnosis not present

## 2020-09-15 DIAGNOSIS — R7989 Other specified abnormal findings of blood chemistry: Secondary | ICD-10-CM | POA: Diagnosis not present

## 2020-09-15 DIAGNOSIS — E782 Mixed hyperlipidemia: Secondary | ICD-10-CM | POA: Diagnosis not present

## 2020-09-15 DIAGNOSIS — E559 Vitamin D deficiency, unspecified: Secondary | ICD-10-CM | POA: Diagnosis not present

## 2020-09-15 DIAGNOSIS — E039 Hypothyroidism, unspecified: Secondary | ICD-10-CM | POA: Diagnosis not present

## 2020-09-21 DIAGNOSIS — N951 Menopausal and female climacteric states: Secondary | ICD-10-CM | POA: Diagnosis not present

## 2020-09-25 DIAGNOSIS — E039 Hypothyroidism, unspecified: Secondary | ICD-10-CM | POA: Diagnosis not present

## 2020-09-25 DIAGNOSIS — R002 Palpitations: Secondary | ICD-10-CM | POA: Diagnosis not present

## 2020-09-25 DIAGNOSIS — I10 Essential (primary) hypertension: Secondary | ICD-10-CM | POA: Diagnosis not present

## 2020-10-15 DIAGNOSIS — H04223 Epiphora due to insufficient drainage, bilateral lacrimal glands: Secondary | ICD-10-CM | POA: Diagnosis not present

## 2020-10-15 DIAGNOSIS — H04553 Acquired stenosis of bilateral nasolacrimal duct: Secondary | ICD-10-CM | POA: Diagnosis not present

## 2020-10-23 DIAGNOSIS — R002 Palpitations: Secondary | ICD-10-CM | POA: Diagnosis not present

## 2020-10-23 DIAGNOSIS — E039 Hypothyroidism, unspecified: Secondary | ICD-10-CM | POA: Diagnosis not present

## 2020-10-23 DIAGNOSIS — I10 Essential (primary) hypertension: Secondary | ICD-10-CM | POA: Diagnosis not present

## 2020-11-06 DIAGNOSIS — N951 Menopausal and female climacteric states: Secondary | ICD-10-CM | POA: Diagnosis not present

## 2020-12-10 DIAGNOSIS — H04553 Acquired stenosis of bilateral nasolacrimal duct: Secondary | ICD-10-CM | POA: Diagnosis not present

## 2020-12-10 DIAGNOSIS — H04223 Epiphora due to insufficient drainage, bilateral lacrimal glands: Secondary | ICD-10-CM | POA: Diagnosis not present

## 2020-12-14 DIAGNOSIS — N951 Menopausal and female climacteric states: Secondary | ICD-10-CM | POA: Diagnosis not present

## 2020-12-14 DIAGNOSIS — L570 Actinic keratosis: Secondary | ICD-10-CM | POA: Diagnosis not present

## 2020-12-14 DIAGNOSIS — Z85828 Personal history of other malignant neoplasm of skin: Secondary | ICD-10-CM | POA: Diagnosis not present

## 2020-12-22 DIAGNOSIS — N951 Menopausal and female climacteric states: Secondary | ICD-10-CM | POA: Diagnosis not present

## 2021-01-06 DIAGNOSIS — H52203 Unspecified astigmatism, bilateral: Secondary | ICD-10-CM | POA: Diagnosis not present

## 2021-01-06 DIAGNOSIS — H04222 Epiphora due to insufficient drainage, left lacrimal gland: Secondary | ICD-10-CM | POA: Diagnosis not present

## 2021-01-06 DIAGNOSIS — Z961 Presence of intraocular lens: Secondary | ICD-10-CM | POA: Diagnosis not present

## 2021-01-06 DIAGNOSIS — H43813 Vitreous degeneration, bilateral: Secondary | ICD-10-CM | POA: Diagnosis not present

## 2021-02-07 DIAGNOSIS — E039 Hypothyroidism, unspecified: Secondary | ICD-10-CM | POA: Diagnosis not present

## 2021-03-11 DIAGNOSIS — K580 Irritable bowel syndrome with diarrhea: Secondary | ICD-10-CM | POA: Diagnosis not present

## 2021-03-11 DIAGNOSIS — H04222 Epiphora due to insufficient drainage, left lacrimal gland: Secondary | ICD-10-CM | POA: Diagnosis not present

## 2021-03-11 DIAGNOSIS — H04553 Acquired stenosis of bilateral nasolacrimal duct: Secondary | ICD-10-CM | POA: Diagnosis not present

## 2021-03-11 DIAGNOSIS — H02831 Dermatochalasis of right upper eyelid: Secondary | ICD-10-CM | POA: Diagnosis not present

## 2021-03-11 DIAGNOSIS — K2270 Barrett's esophagus without dysplasia: Secondary | ICD-10-CM | POA: Diagnosis not present

## 2021-03-11 DIAGNOSIS — R109 Unspecified abdominal pain: Secondary | ICD-10-CM | POA: Diagnosis not present

## 2021-03-11 DIAGNOSIS — H04543 Stenosis of bilateral lacrimal canaliculi: Secondary | ICD-10-CM | POA: Diagnosis not present

## 2021-03-11 DIAGNOSIS — K900 Celiac disease: Secondary | ICD-10-CM | POA: Diagnosis not present

## 2021-03-11 DIAGNOSIS — H02834 Dermatochalasis of left upper eyelid: Secondary | ICD-10-CM | POA: Diagnosis not present

## 2021-03-11 DIAGNOSIS — H57813 Brow ptosis, bilateral: Secondary | ICD-10-CM | POA: Diagnosis not present

## 2021-03-11 DIAGNOSIS — K219 Gastro-esophageal reflux disease without esophagitis: Secondary | ICD-10-CM | POA: Diagnosis not present

## 2021-03-11 DIAGNOSIS — Q104 Absence and agenesis of lacrimal apparatus: Secondary | ICD-10-CM | POA: Diagnosis not present

## 2021-03-11 DIAGNOSIS — Z79899 Other long term (current) drug therapy: Secondary | ICD-10-CM | POA: Diagnosis not present

## 2021-03-16 DIAGNOSIS — L821 Other seborrheic keratosis: Secondary | ICD-10-CM | POA: Diagnosis not present

## 2021-03-16 DIAGNOSIS — L570 Actinic keratosis: Secondary | ICD-10-CM | POA: Diagnosis not present

## 2021-03-16 DIAGNOSIS — Z85828 Personal history of other malignant neoplasm of skin: Secondary | ICD-10-CM | POA: Diagnosis not present

## 2021-03-16 DIAGNOSIS — D044 Carcinoma in situ of skin of scalp and neck: Secondary | ICD-10-CM | POA: Diagnosis not present

## 2021-03-16 DIAGNOSIS — D485 Neoplasm of uncertain behavior of skin: Secondary | ICD-10-CM | POA: Diagnosis not present

## 2021-03-16 DIAGNOSIS — L812 Freckles: Secondary | ICD-10-CM | POA: Diagnosis not present

## 2021-05-24 ENCOUNTER — Other Ambulatory Visit: Payer: Self-pay

## 2021-05-24 ENCOUNTER — Encounter: Payer: Self-pay | Admitting: Family Medicine

## 2021-05-24 ENCOUNTER — Ambulatory Visit (INDEPENDENT_AMBULATORY_CARE_PROVIDER_SITE_OTHER): Payer: Medicare Other | Admitting: Family Medicine

## 2021-05-24 DIAGNOSIS — M25531 Pain in right wrist: Secondary | ICD-10-CM

## 2021-05-24 NOTE — Progress Notes (Signed)
Office Visit Note   Patient: Wanda Downs           Date of Birth: 1941-09-14           MRN: [id number] Visit Date: 05/24/2021 Requested by: No referring provider defined for this encounter. PCP: No primary care provider on file.  Subjective: Chief Complaint  Patient presents with   Right Wrist - Pain    "Horrible" pain in the wrist x 1 week. Pain radiates up her arm. Right-hand dominant. Requests a cortisone injection. Has had one years ago in the left wrist, before playing in a golf tournament -- worked very well.     HPI: She is here with right wrist pain.  She is right-hand dominant.  She started having pain on the ulnar side of the wrist about a month ago, but horrible pain for about a week.  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  She has a history of celiac disease and irritable bowel syndrome.  She has multiple other arthritic joints primarily in her fingers.                ROS:   All other systems were reviewed and are negative.  Objective: Vital Signs: There were no vitals taken for this visit.  Physical Exam:  General:  Alert and oriented, in no acute distress. Pulm:  Breathing unlabored. Psy:  Normal mood, congruent affect. Skin: No erythema Right wrist: She is tender to palpation near the TFC.  She has tenderness along the ECU tendon as well.  No subluxation detected.    Imaging: No results found.  Assessment & Plan: Right wrist pain, possible ECU tendinopathy versus TFC injury -Discussed with patient, she wants to try an injection.  She will follow-up as needed.     Procedures: Right wrist injection: After sterile prep with Betadine, injected 1-1/2 cc 1% lidocaine without epinephrine and 40 mg Depo-Medrol into the region of the TFCC.       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   BACK PAIN 05/24/2010   HYPOTHYROIDISM 09/12/2007   DEPRESSION  09/12/2007   COMMON MIGRAINE 09/12/2007   Celiac disease 09/12/2007   OSTEOARTHRITIS 09/12/2007   OSTEOPENIA 09/12/2007   LIVER FUNCTION TESTS, ABNORMAL 09/12/2007   Past Medical History:  Diagnosis Date   Celiac disease    Depression    Elevated LFTs    check Hep B & C   FHx: migraine headaches    Fibromyalgia    Hypothyroidism    Osteoarthritis    Osteopenia     Family History  Problem Relation Age of Onset   Hypertension Mother    Arthritis Mother     Past Surgical History:  Procedure Laterality Date   ABDOMINAL HYSTERECTOMY     Bilateral S & O   AUGMENTATION MAMMAPLASTY Bilateral    BLADDER SURGERY     bone spur     left knee   bone spur     hand   BREAST SURGERY     Breast implants X 2   COSMETIC SURGERY     Plastic surgery (face)   EXCISION MORTON'S NEUROMA     KNEE ARTHROSCOPY     right knee   NASAL SINUS SURGERY     right hand fx     Social History   Occupational History   Not on file  Tobacco Use   Smoking status:  Former   Smokeless tobacco: Never  Substance and Sexual Activity   Alcohol use: No   Drug use: No   Sexual activity: Not on file

## 2021-05-25 DIAGNOSIS — N951 Menopausal and female climacteric states: Secondary | ICD-10-CM | POA: Diagnosis not present

## 2021-05-27 DIAGNOSIS — N951 Menopausal and female climacteric states: Secondary | ICD-10-CM | POA: Diagnosis not present

## 2021-06-04 DIAGNOSIS — E782 Mixed hyperlipidemia: Secondary | ICD-10-CM | POA: Diagnosis not present

## 2021-06-04 DIAGNOSIS — E039 Hypothyroidism, unspecified: Secondary | ICD-10-CM | POA: Diagnosis not present

## 2021-06-04 DIAGNOSIS — E559 Vitamin D deficiency, unspecified: Secondary | ICD-10-CM | POA: Diagnosis not present

## 2021-06-04 DIAGNOSIS — N951 Menopausal and female climacteric states: Secondary | ICD-10-CM | POA: Diagnosis not present

## 2021-06-04 DIAGNOSIS — R7989 Other specified abnormal findings of blood chemistry: Secondary | ICD-10-CM | POA: Diagnosis not present

## 2021-06-08 DIAGNOSIS — N951 Menopausal and female climacteric states: Secondary | ICD-10-CM | POA: Diagnosis not present

## 2021-06-17 DIAGNOSIS — L570 Actinic keratosis: Secondary | ICD-10-CM | POA: Diagnosis not present

## 2021-06-17 DIAGNOSIS — D044 Carcinoma in situ of skin of scalp and neck: Secondary | ICD-10-CM | POA: Diagnosis not present

## 2021-06-17 DIAGNOSIS — D485 Neoplasm of uncertain behavior of skin: Secondary | ICD-10-CM | POA: Diagnosis not present

## 2021-06-17 DIAGNOSIS — L821 Other seborrheic keratosis: Secondary | ICD-10-CM | POA: Diagnosis not present

## 2021-06-17 DIAGNOSIS — L812 Freckles: Secondary | ICD-10-CM | POA: Diagnosis not present

## 2021-06-17 DIAGNOSIS — C44729 Squamous cell carcinoma of skin of left lower limb, including hip: Secondary | ICD-10-CM | POA: Diagnosis not present

## 2021-06-17 DIAGNOSIS — C4442 Squamous cell carcinoma of skin of scalp and neck: Secondary | ICD-10-CM | POA: Diagnosis not present

## 2021-06-17 DIAGNOSIS — Z85828 Personal history of other malignant neoplasm of skin: Secondary | ICD-10-CM | POA: Diagnosis not present

## 2021-07-07 DIAGNOSIS — Z23 Encounter for immunization: Secondary | ICD-10-CM | POA: Diagnosis not present

## 2021-07-28 DIAGNOSIS — E785 Hyperlipidemia, unspecified: Secondary | ICD-10-CM | POA: Diagnosis not present

## 2021-07-28 DIAGNOSIS — M810 Age-related osteoporosis without current pathological fracture: Secondary | ICD-10-CM | POA: Diagnosis not present

## 2021-07-28 DIAGNOSIS — E039 Hypothyroidism, unspecified: Secondary | ICD-10-CM | POA: Diagnosis not present

## 2021-07-28 DIAGNOSIS — I10 Essential (primary) hypertension: Secondary | ICD-10-CM | POA: Diagnosis not present

## 2021-08-03 DIAGNOSIS — N951 Menopausal and female climacteric states: Secondary | ICD-10-CM | POA: Diagnosis not present

## 2021-08-04 DIAGNOSIS — E785 Hyperlipidemia, unspecified: Secondary | ICD-10-CM | POA: Diagnosis not present

## 2021-08-04 DIAGNOSIS — M797 Fibromyalgia: Secondary | ICD-10-CM | POA: Diagnosis not present

## 2021-08-04 DIAGNOSIS — R5383 Other fatigue: Secondary | ICD-10-CM | POA: Diagnosis not present

## 2021-08-04 DIAGNOSIS — F330 Major depressive disorder, recurrent, mild: Secondary | ICD-10-CM | POA: Diagnosis not present

## 2021-08-04 DIAGNOSIS — Z1339 Encounter for screening examination for other mental health and behavioral disorders: Secondary | ICD-10-CM | POA: Diagnosis not present

## 2021-08-04 DIAGNOSIS — F419 Anxiety disorder, unspecified: Secondary | ICD-10-CM | POA: Diagnosis not present

## 2021-08-04 DIAGNOSIS — I10 Essential (primary) hypertension: Secondary | ICD-10-CM | POA: Diagnosis not present

## 2021-08-04 DIAGNOSIS — M810 Age-related osteoporosis without current pathological fracture: Secondary | ICD-10-CM | POA: Diagnosis not present

## 2021-08-04 DIAGNOSIS — E039 Hypothyroidism, unspecified: Secondary | ICD-10-CM | POA: Diagnosis not present

## 2021-08-04 DIAGNOSIS — Z1331 Encounter for screening for depression: Secondary | ICD-10-CM | POA: Diagnosis not present

## 2021-08-04 DIAGNOSIS — K219 Gastro-esophageal reflux disease without esophagitis: Secondary | ICD-10-CM | POA: Diagnosis not present

## 2021-08-04 DIAGNOSIS — Z0000 Encounter for general adult medical examination without abnormal findings: Secondary | ICD-10-CM | POA: Diagnosis not present

## 2021-08-12 DIAGNOSIS — K900 Celiac disease: Secondary | ICD-10-CM | POA: Diagnosis not present

## 2021-08-12 DIAGNOSIS — K8681 Exocrine pancreatic insufficiency: Secondary | ICD-10-CM | POA: Diagnosis not present

## 2021-08-12 DIAGNOSIS — Z8719 Personal history of other diseases of the digestive system: Secondary | ICD-10-CM | POA: Diagnosis not present

## 2021-08-12 DIAGNOSIS — R140 Abdominal distension (gaseous): Secondary | ICD-10-CM | POA: Diagnosis not present

## 2021-08-12 DIAGNOSIS — K219 Gastro-esophageal reflux disease without esophagitis: Secondary | ICD-10-CM | POA: Diagnosis not present

## 2021-08-12 DIAGNOSIS — R233 Spontaneous ecchymoses: Secondary | ICD-10-CM | POA: Diagnosis not present

## 2021-08-12 DIAGNOSIS — K449 Diaphragmatic hernia without obstruction or gangrene: Secondary | ICD-10-CM | POA: Diagnosis not present

## 2021-08-12 DIAGNOSIS — K2970 Gastritis, unspecified, without bleeding: Secondary | ICD-10-CM | POA: Diagnosis not present

## 2021-08-12 DIAGNOSIS — K580 Irritable bowel syndrome with diarrhea: Secondary | ICD-10-CM | POA: Diagnosis not present

## 2021-08-20 DIAGNOSIS — E039 Hypothyroidism, unspecified: Secondary | ICD-10-CM | POA: Diagnosis not present

## 2021-09-07 DIAGNOSIS — E039 Hypothyroidism, unspecified: Secondary | ICD-10-CM | POA: Diagnosis not present

## 2021-09-13 DIAGNOSIS — K900 Celiac disease: Secondary | ICD-10-CM | POA: Diagnosis not present

## 2021-09-13 DIAGNOSIS — F419 Anxiety disorder, unspecified: Secondary | ICD-10-CM | POA: Diagnosis not present

## 2021-09-13 DIAGNOSIS — K219 Gastro-esophageal reflux disease without esophagitis: Secondary | ICD-10-CM | POA: Diagnosis not present

## 2021-09-13 DIAGNOSIS — I10 Essential (primary) hypertension: Secondary | ICD-10-CM | POA: Diagnosis not present

## 2021-09-16 DIAGNOSIS — L821 Other seborrheic keratosis: Secondary | ICD-10-CM | POA: Diagnosis not present

## 2021-09-16 DIAGNOSIS — L218 Other seborrheic dermatitis: Secondary | ICD-10-CM | POA: Diagnosis not present

## 2021-09-16 DIAGNOSIS — L570 Actinic keratosis: Secondary | ICD-10-CM | POA: Diagnosis not present

## 2021-09-16 DIAGNOSIS — Z85828 Personal history of other malignant neoplasm of skin: Secondary | ICD-10-CM | POA: Diagnosis not present

## 2021-10-12 DIAGNOSIS — R7989 Other specified abnormal findings of blood chemistry: Secondary | ICD-10-CM | POA: Diagnosis not present

## 2021-10-12 DIAGNOSIS — E559 Vitamin D deficiency, unspecified: Secondary | ICD-10-CM | POA: Diagnosis not present

## 2021-10-12 DIAGNOSIS — E039 Hypothyroidism, unspecified: Secondary | ICD-10-CM | POA: Diagnosis not present

## 2021-10-12 DIAGNOSIS — E782 Mixed hyperlipidemia: Secondary | ICD-10-CM | POA: Diagnosis not present

## 2021-10-12 DIAGNOSIS — N951 Menopausal and female climacteric states: Secondary | ICD-10-CM | POA: Diagnosis not present

## 2021-10-19 DIAGNOSIS — N951 Menopausal and female climacteric states: Secondary | ICD-10-CM | POA: Diagnosis not present

## 2021-11-15 DIAGNOSIS — N951 Menopausal and female climacteric states: Secondary | ICD-10-CM | POA: Diagnosis not present

## 2021-11-15 DIAGNOSIS — K900 Celiac disease: Secondary | ICD-10-CM | POA: Diagnosis not present

## 2021-11-15 DIAGNOSIS — F419 Anxiety disorder, unspecified: Secondary | ICD-10-CM | POA: Diagnosis not present

## 2021-11-15 DIAGNOSIS — K219 Gastro-esophageal reflux disease without esophagitis: Secondary | ICD-10-CM | POA: Diagnosis not present

## 2021-11-15 DIAGNOSIS — I10 Essential (primary) hypertension: Secondary | ICD-10-CM | POA: Diagnosis not present

## 2021-12-08 DIAGNOSIS — K900 Celiac disease: Secondary | ICD-10-CM | POA: Diagnosis not present

## 2021-12-15 DIAGNOSIS — L570 Actinic keratosis: Secondary | ICD-10-CM | POA: Diagnosis not present

## 2021-12-15 DIAGNOSIS — L821 Other seborrheic keratosis: Secondary | ICD-10-CM | POA: Diagnosis not present

## 2021-12-15 DIAGNOSIS — L218 Other seborrheic dermatitis: Secondary | ICD-10-CM | POA: Diagnosis not present

## 2021-12-15 DIAGNOSIS — L812 Freckles: Secondary | ICD-10-CM | POA: Diagnosis not present

## 2021-12-15 DIAGNOSIS — Z85828 Personal history of other malignant neoplasm of skin: Secondary | ICD-10-CM | POA: Diagnosis not present

## 2021-12-15 DIAGNOSIS — L853 Xerosis cutis: Secondary | ICD-10-CM | POA: Diagnosis not present

## 2021-12-21 DIAGNOSIS — E039 Hypothyroidism, unspecified: Secondary | ICD-10-CM | POA: Diagnosis not present

## 2022-01-26 DIAGNOSIS — M79643 Pain in unspecified hand: Secondary | ICD-10-CM | POA: Diagnosis not present

## 2022-01-26 DIAGNOSIS — R5383 Other fatigue: Secondary | ICD-10-CM | POA: Diagnosis not present

## 2022-02-15 DIAGNOSIS — E039 Hypothyroidism, unspecified: Secondary | ICD-10-CM | POA: Diagnosis not present

## 2022-03-09 DIAGNOSIS — R5383 Other fatigue: Secondary | ICD-10-CM | POA: Diagnosis not present

## 2022-03-31 DIAGNOSIS — L570 Actinic keratosis: Secondary | ICD-10-CM | POA: Diagnosis not present

## 2022-03-31 DIAGNOSIS — Z85828 Personal history of other malignant neoplasm of skin: Secondary | ICD-10-CM | POA: Diagnosis not present

## 2022-03-31 DIAGNOSIS — D485 Neoplasm of uncertain behavior of skin: Secondary | ICD-10-CM | POA: Diagnosis not present

## 2022-03-31 DIAGNOSIS — C44722 Squamous cell carcinoma of skin of right lower limb, including hip: Secondary | ICD-10-CM | POA: Diagnosis not present

## 2022-03-31 DIAGNOSIS — L821 Other seborrheic keratosis: Secondary | ICD-10-CM | POA: Diagnosis not present

## 2022-03-31 DIAGNOSIS — L853 Xerosis cutis: Secondary | ICD-10-CM | POA: Diagnosis not present

## 2022-04-06 DIAGNOSIS — M79643 Pain in unspecified hand: Secondary | ICD-10-CM | POA: Diagnosis not present

## 2022-04-26 DIAGNOSIS — E559 Vitamin D deficiency, unspecified: Secondary | ICD-10-CM | POA: Diagnosis not present

## 2022-04-26 DIAGNOSIS — E039 Hypothyroidism, unspecified: Secondary | ICD-10-CM | POA: Diagnosis not present

## 2022-04-26 DIAGNOSIS — N951 Menopausal and female climacteric states: Secondary | ICD-10-CM | POA: Diagnosis not present

## 2022-04-26 DIAGNOSIS — E782 Mixed hyperlipidemia: Secondary | ICD-10-CM | POA: Diagnosis not present

## 2022-04-26 DIAGNOSIS — R7989 Other specified abnormal findings of blood chemistry: Secondary | ICD-10-CM | POA: Diagnosis not present

## 2022-05-04 DIAGNOSIS — M79643 Pain in unspecified hand: Secondary | ICD-10-CM | POA: Diagnosis not present

## 2022-05-13 DIAGNOSIS — M79643 Pain in unspecified hand: Secondary | ICD-10-CM | POA: Diagnosis not present

## 2022-05-27 DIAGNOSIS — I10 Essential (primary) hypertension: Secondary | ICD-10-CM | POA: Diagnosis not present

## 2022-05-27 DIAGNOSIS — K2270 Barrett's esophagus without dysplasia: Secondary | ICD-10-CM | POA: Diagnosis not present

## 2022-05-27 DIAGNOSIS — Z882 Allergy status to sulfonamides status: Secondary | ICD-10-CM | POA: Diagnosis not present

## 2022-05-27 DIAGNOSIS — K219 Gastro-esophageal reflux disease without esophagitis: Secondary | ICD-10-CM | POA: Diagnosis not present

## 2022-05-27 DIAGNOSIS — F0390 Unspecified dementia without behavioral disturbance: Secondary | ICD-10-CM | POA: Diagnosis not present

## 2022-05-27 DIAGNOSIS — K900 Celiac disease: Secondary | ICD-10-CM | POA: Diagnosis not present

## 2022-05-27 DIAGNOSIS — Z880 Allergy status to penicillin: Secondary | ICD-10-CM | POA: Diagnosis not present

## 2022-05-27 DIAGNOSIS — Z79899 Other long term (current) drug therapy: Secondary | ICD-10-CM | POA: Diagnosis not present

## 2022-06-08 DIAGNOSIS — M79643 Pain in unspecified hand: Secondary | ICD-10-CM | POA: Diagnosis not present

## 2022-07-04 DIAGNOSIS — L570 Actinic keratosis: Secondary | ICD-10-CM | POA: Diagnosis not present

## 2022-07-04 DIAGNOSIS — L812 Freckles: Secondary | ICD-10-CM | POA: Diagnosis not present

## 2022-07-04 DIAGNOSIS — Z85828 Personal history of other malignant neoplasm of skin: Secondary | ICD-10-CM | POA: Diagnosis not present

## 2022-07-04 DIAGNOSIS — L821 Other seborrheic keratosis: Secondary | ICD-10-CM | POA: Diagnosis not present

## 2022-07-13 DIAGNOSIS — M79643 Pain in unspecified hand: Secondary | ICD-10-CM | POA: Diagnosis not present

## 2022-07-13 DIAGNOSIS — R5383 Other fatigue: Secondary | ICD-10-CM | POA: Diagnosis not present

## 2022-07-30 DIAGNOSIS — Z23 Encounter for immunization: Secondary | ICD-10-CM | POA: Diagnosis not present

## 2022-08-10 DIAGNOSIS — M810 Age-related osteoporosis without current pathological fracture: Secondary | ICD-10-CM | POA: Diagnosis not present

## 2022-08-10 DIAGNOSIS — F419 Anxiety disorder, unspecified: Secondary | ICD-10-CM | POA: Diagnosis not present

## 2022-08-10 DIAGNOSIS — E785 Hyperlipidemia, unspecified: Secondary | ICD-10-CM | POA: Diagnosis not present

## 2022-08-10 DIAGNOSIS — R7989 Other specified abnormal findings of blood chemistry: Secondary | ICD-10-CM | POA: Diagnosis not present

## 2022-08-10 DIAGNOSIS — E039 Hypothyroidism, unspecified: Secondary | ICD-10-CM | POA: Diagnosis not present

## 2022-08-10 DIAGNOSIS — I10 Essential (primary) hypertension: Secondary | ICD-10-CM | POA: Diagnosis not present

## 2022-08-10 DIAGNOSIS — R5383 Other fatigue: Secondary | ICD-10-CM | POA: Diagnosis not present

## 2022-08-17 DIAGNOSIS — Z1331 Encounter for screening for depression: Secondary | ICD-10-CM | POA: Diagnosis not present

## 2022-08-17 DIAGNOSIS — I10 Essential (primary) hypertension: Secondary | ICD-10-CM | POA: Diagnosis not present

## 2022-08-17 DIAGNOSIS — F330 Major depressive disorder, recurrent, mild: Secondary | ICD-10-CM | POA: Diagnosis not present

## 2022-08-17 DIAGNOSIS — M810 Age-related osteoporosis without current pathological fracture: Secondary | ICD-10-CM | POA: Diagnosis not present

## 2022-08-17 DIAGNOSIS — R82998 Other abnormal findings in urine: Secondary | ICD-10-CM | POA: Diagnosis not present

## 2022-08-17 DIAGNOSIS — Z1339 Encounter for screening examination for other mental health and behavioral disorders: Secondary | ICD-10-CM | POA: Diagnosis not present

## 2022-08-17 DIAGNOSIS — K900 Celiac disease: Secondary | ICD-10-CM | POA: Diagnosis not present

## 2022-08-17 DIAGNOSIS — E785 Hyperlipidemia, unspecified: Secondary | ICD-10-CM | POA: Diagnosis not present

## 2022-08-17 DIAGNOSIS — Z0000 Encounter for general adult medical examination without abnormal findings: Secondary | ICD-10-CM | POA: Diagnosis not present

## 2022-08-17 DIAGNOSIS — K219 Gastro-esophageal reflux disease without esophagitis: Secondary | ICD-10-CM | POA: Diagnosis not present

## 2022-08-17 DIAGNOSIS — E039 Hypothyroidism, unspecified: Secondary | ICD-10-CM | POA: Diagnosis not present

## 2022-08-17 DIAGNOSIS — F419 Anxiety disorder, unspecified: Secondary | ICD-10-CM | POA: Diagnosis not present

## 2022-08-17 DIAGNOSIS — M797 Fibromyalgia: Secondary | ICD-10-CM | POA: Diagnosis not present

## 2022-08-17 DIAGNOSIS — R5383 Other fatigue: Secondary | ICD-10-CM | POA: Diagnosis not present

## 2022-08-23 ENCOUNTER — Ambulatory Visit (INDEPENDENT_AMBULATORY_CARE_PROVIDER_SITE_OTHER): Payer: Medicare Other

## 2022-08-23 ENCOUNTER — Encounter: Payer: Self-pay | Admitting: Orthopaedic Surgery

## 2022-08-23 ENCOUNTER — Ambulatory Visit (INDEPENDENT_AMBULATORY_CARE_PROVIDER_SITE_OTHER): Payer: Medicare Other | Admitting: Orthopaedic Surgery

## 2022-08-23 VITALS — BP 148/86 | HR 89 | Ht 61.0 in | Wt 116.0 lb

## 2022-08-23 DIAGNOSIS — M25512 Pain in left shoulder: Secondary | ICD-10-CM | POA: Diagnosis not present

## 2022-08-23 DIAGNOSIS — M542 Cervicalgia: Secondary | ICD-10-CM | POA: Diagnosis not present

## 2022-08-23 NOTE — Progress Notes (Signed)
Office Visit Note   Patient: Wanda Downs           Date of Birth: 08-09-1941           MRN: [id number] Visit Date: 08/23/2022              Requested by: No referring provider defined for this encounter. PCP: Pcp, No   Assessment & Plan: Visit Diagnoses:  1. Neck pain   2. Acute pain of left shoulder     Plan: X-rays are consistent with supraspinatus tear.  She is able to get her arm up overhead fairly easily.  Subacromial injection performed which gave her good relief of pain.  We discussed that if she had persistent pain problems then MRI scan would be indicated.  She may have some partial biceps tearing giving her current pain but would need an MRI scan to evaluate and diagnose this.  She will call us if she is having ongoing problems.  Follow-Up Instructions: No follow-ups on file.   Orders:  Orders Placed This Encounter  Procedures   XR Cervical Spine 2 or 3 views   XR Shoulder Left   No orders of the defined types were placed in this encounter.     Procedures: No procedures performed   Clinical Data: No additional findings.   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  She has good range of motion with states she cannot sleep on the left side and she is miserable.  She has pain that radiates to the mid humeral level.  Has arthritis in her fingers but states she can still grip a golf club but cannot flex fingertips to distal palmar crease.  Review of Systems positive history of hypothyroidism depression and spondyloarthropathy.   Objective: Vital Signs: BP (!) 148/86   Pulse 89   Ht _0  (1.549 m)   Wt 116 lb (52.6 kg)   BMI 21.92 kg/m   Physical Exam Constitutional:      Appearance: She is well-developed.  HENT:     Head: Normocephalic.     Right Ear: External ear normal.     Left Ear: External ear  normal. There is no impacted cerumen.  Eyes:     Pupils: Pupils are equal, round, and reactive to light.  Neck:     Thyroid: No thyromegaly.     Trachea: No tracheal deviation.  Cardiovascular:     Rate and Rhythm: Normal rate.  Pulmonary:     Effort: Pulmonary effort is normal.  Abdominal:     Palpations: Abdomen is soft.  Musculoskeletal:     Cervical back: No rigidity.  Skin:    General: Skin is warm and dry.  Neurological:     Mental Status: She is alert and oriented to person, place, and time.  Psychiatric:        Behavior: Behavior normal.     Ortho Exam positive left shoulder impingement negative right.  Pain with external rotation limited to 45 degrees with the elbow at her side.  Long head of the biceps mild to moderately tender.  Negative subscap weakness.  Patient has brachial plexus tenderness moderate right and left but is able to rapidly rotate her neck tilted without radicular pain.  Arthritic changes seen DIP PIP joints both hands.  Specialty Comments:  No specialty comments available.  Imaging: XR Cervical Spine 2 or 3 views  Result Date: 08/23/2022 AP lateral cervical spine images show spondyloarthropathy with significant disc base narrowing at C3-4, C4-5 C5-6 C6-7 with maintenance of cervical lordosis.  Difficult to determine the disc space due to spurring. Impression: Multilevel cervical spondylosis with changes suggestive of spondyloarthropathy.  XR Shoulder Left  Result Date: 08/23/2022 AP lateral Y scapular x-ray obtained left shoulder.  This shows inferior glenoid spurring prominent with high riding humeral head and some joint space narrowing consistent with rotator cuff arthropathy and likely rotator cuff supraspinatus tear. Impression: Changes consistent with rotator cuff arthropathy.    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   BACK PAIN 05/24/2010   HYPOTHYROIDISM 09/12/2007    DEPRESSION 09/12/2007   COMMON MIGRAINE 09/12/2007   Celiac disease 09/12/2007   OSTEOARTHRITIS 09/12/2007   OSTEOPENIA 09/12/2007   LIVER FUNCTION TESTS, ABNORMAL 09/12/2007   Past Medical History:  Diagnosis Date   Celiac disease    Depression    Elevated LFTs    check Hep B & C   FHx: migraine headaches    Fibromyalgia    Hypothyroidism    Osteoarthritis    Osteopenia     Family History  Problem Relation Age of Onset   Hypertension Mother    Arthritis Mother     Past Surgical History:  Procedure Laterality Date   ABDOMINAL HYSTERECTOMY     Bilateral S & O   AUGMENTATION MAMMAPLASTY Bilateral    BLADDER SURGERY     bone spur     left knee   bone spur     hand   BREAST SURGERY     Breast implants X 2   COSMETIC SURGERY     Plastic surgery (face)   EXCISION MORTON'S NEUROMA     KNEE ARTHROSCOPY     right knee   NASAL SINUS SURGERY     right hand fx     Social History   Occupational History   Not on file  Tobacco Use   Smoking status: Former   Smokeless tobacco: Never  Substance and Sexual Activity   Alcohol use: No   Drug use: No   Sexual activity: Not on file

## 2022-08-24 DIAGNOSIS — K900 Celiac disease: Secondary | ICD-10-CM | POA: Diagnosis not present

## 2022-08-26 DIAGNOSIS — M545 Low back pain, unspecified: Secondary | ICD-10-CM | POA: Diagnosis not present

## 2022-08-26 DIAGNOSIS — M9903 Segmental and somatic dysfunction of lumbar region: Secondary | ICD-10-CM | POA: Diagnosis not present

## 2022-08-26 DIAGNOSIS — M62830 Muscle spasm of back: Secondary | ICD-10-CM | POA: Diagnosis not present

## 2022-08-26 DIAGNOSIS — M5136 Other intervertebral disc degeneration, lumbar region: Secondary | ICD-10-CM | POA: Diagnosis not present

## 2022-08-29 DIAGNOSIS — M5136 Other intervertebral disc degeneration, lumbar region: Secondary | ICD-10-CM | POA: Diagnosis not present

## 2022-08-29 DIAGNOSIS — M545 Low back pain, unspecified: Secondary | ICD-10-CM | POA: Diagnosis not present

## 2022-08-29 DIAGNOSIS — M9903 Segmental and somatic dysfunction of lumbar region: Secondary | ICD-10-CM | POA: Diagnosis not present

## 2022-08-29 DIAGNOSIS — M62830 Muscle spasm of back: Secondary | ICD-10-CM | POA: Diagnosis not present

## 2022-08-30 DIAGNOSIS — M545 Low back pain, unspecified: Secondary | ICD-10-CM | POA: Diagnosis not present

## 2022-08-30 DIAGNOSIS — M5136 Other intervertebral disc degeneration, lumbar region: Secondary | ICD-10-CM | POA: Diagnosis not present

## 2022-08-30 DIAGNOSIS — M9903 Segmental and somatic dysfunction of lumbar region: Secondary | ICD-10-CM | POA: Diagnosis not present

## 2022-08-30 DIAGNOSIS — M62830 Muscle spasm of back: Secondary | ICD-10-CM | POA: Diagnosis not present

## 2022-09-05 DIAGNOSIS — M9903 Segmental and somatic dysfunction of lumbar region: Secondary | ICD-10-CM | POA: Diagnosis not present

## 2022-09-05 DIAGNOSIS — M545 Low back pain, unspecified: Secondary | ICD-10-CM | POA: Diagnosis not present

## 2022-09-05 DIAGNOSIS — M5136 Other intervertebral disc degeneration, lumbar region: Secondary | ICD-10-CM | POA: Diagnosis not present

## 2022-09-05 DIAGNOSIS — M62830 Muscle spasm of back: Secondary | ICD-10-CM | POA: Diagnosis not present

## 2022-09-08 DIAGNOSIS — M62830 Muscle spasm of back: Secondary | ICD-10-CM | POA: Diagnosis not present

## 2022-09-08 DIAGNOSIS — M9903 Segmental and somatic dysfunction of lumbar region: Secondary | ICD-10-CM | POA: Diagnosis not present

## 2022-09-08 DIAGNOSIS — M545 Low back pain, unspecified: Secondary | ICD-10-CM | POA: Diagnosis not present

## 2022-09-08 DIAGNOSIS — M5136 Other intervertebral disc degeneration, lumbar region: Secondary | ICD-10-CM | POA: Diagnosis not present

## 2022-09-12 DIAGNOSIS — M5136 Other intervertebral disc degeneration, lumbar region: Secondary | ICD-10-CM | POA: Diagnosis not present

## 2022-09-12 DIAGNOSIS — M9903 Segmental and somatic dysfunction of lumbar region: Secondary | ICD-10-CM | POA: Diagnosis not present

## 2022-09-12 DIAGNOSIS — M545 Low back pain, unspecified: Secondary | ICD-10-CM | POA: Diagnosis not present

## 2022-09-12 DIAGNOSIS — M62830 Muscle spasm of back: Secondary | ICD-10-CM | POA: Diagnosis not present

## 2022-09-14 DIAGNOSIS — M62830 Muscle spasm of back: Secondary | ICD-10-CM | POA: Diagnosis not present

## 2022-09-14 DIAGNOSIS — M5136 Other intervertebral disc degeneration, lumbar region: Secondary | ICD-10-CM | POA: Diagnosis not present

## 2022-09-14 DIAGNOSIS — M9903 Segmental and somatic dysfunction of lumbar region: Secondary | ICD-10-CM | POA: Diagnosis not present

## 2022-09-14 DIAGNOSIS — M545 Low back pain, unspecified: Secondary | ICD-10-CM | POA: Diagnosis not present

## 2022-09-20 DIAGNOSIS — M5136 Other intervertebral disc degeneration, lumbar region: Secondary | ICD-10-CM | POA: Diagnosis not present

## 2022-09-20 DIAGNOSIS — M545 Low back pain, unspecified: Secondary | ICD-10-CM | POA: Diagnosis not present

## 2022-09-20 DIAGNOSIS — M62830 Muscle spasm of back: Secondary | ICD-10-CM | POA: Diagnosis not present

## 2022-09-20 DIAGNOSIS — M9903 Segmental and somatic dysfunction of lumbar region: Secondary | ICD-10-CM | POA: Diagnosis not present

## 2022-09-21 DIAGNOSIS — M5136 Other intervertebral disc degeneration, lumbar region: Secondary | ICD-10-CM | POA: Diagnosis not present

## 2022-09-21 DIAGNOSIS — M62830 Muscle spasm of back: Secondary | ICD-10-CM | POA: Diagnosis not present

## 2022-09-21 DIAGNOSIS — M9903 Segmental and somatic dysfunction of lumbar region: Secondary | ICD-10-CM | POA: Diagnosis not present

## 2022-09-21 DIAGNOSIS — M545 Low back pain, unspecified: Secondary | ICD-10-CM | POA: Diagnosis not present

## 2022-09-27 DIAGNOSIS — M5136 Other intervertebral disc degeneration, lumbar region: Secondary | ICD-10-CM | POA: Diagnosis not present

## 2022-09-27 DIAGNOSIS — M9903 Segmental and somatic dysfunction of lumbar region: Secondary | ICD-10-CM | POA: Diagnosis not present

## 2022-09-27 DIAGNOSIS — M545 Low back pain, unspecified: Secondary | ICD-10-CM | POA: Diagnosis not present

## 2022-09-27 DIAGNOSIS — M62830 Muscle spasm of back: Secondary | ICD-10-CM | POA: Diagnosis not present

## 2022-09-28 DIAGNOSIS — M62830 Muscle spasm of back: Secondary | ICD-10-CM | POA: Diagnosis not present

## 2022-09-28 DIAGNOSIS — M9903 Segmental and somatic dysfunction of lumbar region: Secondary | ICD-10-CM | POA: Diagnosis not present

## 2022-09-28 DIAGNOSIS — M545 Low back pain, unspecified: Secondary | ICD-10-CM | POA: Diagnosis not present

## 2022-09-28 DIAGNOSIS — M5136 Other intervertebral disc degeneration, lumbar region: Secondary | ICD-10-CM | POA: Diagnosis not present

## 2022-10-06 DIAGNOSIS — M62830 Muscle spasm of back: Secondary | ICD-10-CM | POA: Diagnosis not present

## 2022-10-06 DIAGNOSIS — M5136 Other intervertebral disc degeneration, lumbar region: Secondary | ICD-10-CM | POA: Diagnosis not present

## 2022-10-06 DIAGNOSIS — M9903 Segmental and somatic dysfunction of lumbar region: Secondary | ICD-10-CM | POA: Diagnosis not present

## 2022-10-06 DIAGNOSIS — M545 Low back pain, unspecified: Secondary | ICD-10-CM | POA: Diagnosis not present

## 2022-10-11 DIAGNOSIS — M5136 Other intervertebral disc degeneration, lumbar region: Secondary | ICD-10-CM | POA: Diagnosis not present

## 2022-10-11 DIAGNOSIS — M545 Low back pain, unspecified: Secondary | ICD-10-CM | POA: Diagnosis not present

## 2022-10-11 DIAGNOSIS — M62830 Muscle spasm of back: Secondary | ICD-10-CM | POA: Diagnosis not present

## 2022-10-11 DIAGNOSIS — M9903 Segmental and somatic dysfunction of lumbar region: Secondary | ICD-10-CM | POA: Diagnosis not present

## 2022-10-13 DIAGNOSIS — M5136 Other intervertebral disc degeneration, lumbar region: Secondary | ICD-10-CM | POA: Diagnosis not present

## 2022-10-13 DIAGNOSIS — M545 Low back pain, unspecified: Secondary | ICD-10-CM | POA: Diagnosis not present

## 2022-10-13 DIAGNOSIS — M62830 Muscle spasm of back: Secondary | ICD-10-CM | POA: Diagnosis not present

## 2022-10-13 DIAGNOSIS — M9903 Segmental and somatic dysfunction of lumbar region: Secondary | ICD-10-CM | POA: Diagnosis not present

## 2022-10-18 DIAGNOSIS — L570 Actinic keratosis: Secondary | ICD-10-CM | POA: Diagnosis not present

## 2022-10-18 DIAGNOSIS — L853 Xerosis cutis: Secondary | ICD-10-CM | POA: Diagnosis not present

## 2022-10-18 DIAGNOSIS — Z85828 Personal history of other malignant neoplasm of skin: Secondary | ICD-10-CM | POA: Diagnosis not present

## 2022-10-18 DIAGNOSIS — L821 Other seborrheic keratosis: Secondary | ICD-10-CM | POA: Diagnosis not present

## 2022-10-18 DIAGNOSIS — L812 Freckles: Secondary | ICD-10-CM | POA: Diagnosis not present

## 2022-10-20 DIAGNOSIS — M5136 Other intervertebral disc degeneration, lumbar region: Secondary | ICD-10-CM | POA: Diagnosis not present

## 2022-10-20 DIAGNOSIS — M9903 Segmental and somatic dysfunction of lumbar region: Secondary | ICD-10-CM | POA: Diagnosis not present

## 2022-10-20 DIAGNOSIS — M62830 Muscle spasm of back: Secondary | ICD-10-CM | POA: Diagnosis not present

## 2022-10-20 DIAGNOSIS — M545 Low back pain, unspecified: Secondary | ICD-10-CM | POA: Diagnosis not present

## 2022-10-24 ENCOUNTER — Emergency Department (HOSPITAL_COMMUNITY): Payer: Medicare Other

## 2022-10-24 ENCOUNTER — Emergency Department (HOSPITAL_COMMUNITY)
Admission: EM | Admit: 2022-10-24 | Discharge: 2022-10-24 | Discharge status: Against Medical Advice | Payer: Medicare Other | Source: Home / Self Care

## 2022-10-24 ENCOUNTER — Inpatient Hospital Stay (HOSPITAL_COMMUNITY)
Admission: EM | Admit: 2022-10-24 | Discharge: 2022-10-26 | DRG: 392 | Disposition: A | Discharge status: Home / Self Care | Payer: Medicare Other | Attending: Family Medicine | Admitting: Family Medicine

## 2022-10-24 ENCOUNTER — Encounter (HOSPITAL_COMMUNITY): Payer: Self-pay

## 2022-10-24 ENCOUNTER — Other Ambulatory Visit: Payer: Self-pay

## 2022-10-24 DIAGNOSIS — Z9882 Breast implant status: Secondary | ICD-10-CM

## 2022-10-24 DIAGNOSIS — R58 Hemorrhage, not elsewhere classified: Secondary | ICD-10-CM | POA: Diagnosis not present

## 2022-10-24 DIAGNOSIS — Z5321 Procedure and treatment not carried out due to patient leaving prior to being seen by health care provider: Secondary | ICD-10-CM | POA: Insufficient documentation

## 2022-10-24 DIAGNOSIS — M8580 Other specified disorders of bone density and structure, unspecified site: Secondary | ICD-10-CM | POA: Diagnosis not present

## 2022-10-24 DIAGNOSIS — K529 Noninfective gastroenteritis and colitis, unspecified: Secondary | ICD-10-CM | POA: Diagnosis present

## 2022-10-24 DIAGNOSIS — Z8261 Family history of arthritis: Secondary | ICD-10-CM

## 2022-10-24 DIAGNOSIS — R1111 Vomiting without nausea: Secondary | ICD-10-CM | POA: Diagnosis not present

## 2022-10-24 DIAGNOSIS — E860 Dehydration: Secondary | ICD-10-CM | POA: Diagnosis present

## 2022-10-24 DIAGNOSIS — Z90710 Acquired absence of both cervix and uterus: Secondary | ICD-10-CM | POA: Diagnosis not present

## 2022-10-24 DIAGNOSIS — K2270 Barrett's esophagus without dysplasia: Secondary | ICD-10-CM | POA: Diagnosis present

## 2022-10-24 DIAGNOSIS — E871 Hypo-osmolality and hyponatremia: Secondary | ICD-10-CM | POA: Diagnosis present

## 2022-10-24 DIAGNOSIS — E876 Hypokalemia: Secondary | ICD-10-CM | POA: Diagnosis present

## 2022-10-24 DIAGNOSIS — Z87891 Personal history of nicotine dependence: Secondary | ICD-10-CM

## 2022-10-24 DIAGNOSIS — F32A Depression, unspecified: Secondary | ICD-10-CM | POA: Diagnosis present

## 2022-10-24 DIAGNOSIS — R933 Abnormal findings on diagnostic imaging of other parts of digestive tract: Secondary | ICD-10-CM | POA: Diagnosis not present

## 2022-10-24 DIAGNOSIS — K922 Gastrointestinal hemorrhage, unspecified: Secondary | ICD-10-CM | POA: Diagnosis present

## 2022-10-24 DIAGNOSIS — E039 Hypothyroidism, unspecified: Secondary | ICD-10-CM | POA: Diagnosis present

## 2022-10-24 DIAGNOSIS — K625 Hemorrhage of anus and rectum: Secondary | ICD-10-CM | POA: Diagnosis not present

## 2022-10-24 DIAGNOSIS — R413 Other amnesia: Secondary | ICD-10-CM | POA: Diagnosis not present

## 2022-10-24 DIAGNOSIS — A09 Infectious gastroenteritis and colitis, unspecified: Secondary | ICD-10-CM | POA: Diagnosis present

## 2022-10-24 DIAGNOSIS — K921 Melena: Secondary | ICD-10-CM | POA: Diagnosis not present

## 2022-10-24 DIAGNOSIS — Z8249 Family history of ischemic heart disease and other diseases of the circulatory system: Secondary | ICD-10-CM | POA: Diagnosis not present

## 2022-10-24 DIAGNOSIS — K900 Celiac disease: Secondary | ICD-10-CM | POA: Diagnosis present

## 2022-10-24 DIAGNOSIS — R112 Nausea with vomiting, unspecified: Secondary | ICD-10-CM | POA: Diagnosis not present

## 2022-10-24 DIAGNOSIS — R1110 Vomiting, unspecified: Secondary | ICD-10-CM | POA: Diagnosis not present

## 2022-10-24 DIAGNOSIS — K580 Irritable bowel syndrome with diarrhea: Secondary | ICD-10-CM | POA: Diagnosis present

## 2022-10-24 DIAGNOSIS — R1084 Generalized abdominal pain: Secondary | ICD-10-CM | POA: Insufficient documentation

## 2022-10-24 DIAGNOSIS — M797 Fibromyalgia: Secondary | ICD-10-CM | POA: Diagnosis not present

## 2022-10-24 DIAGNOSIS — R110 Nausea: Secondary | ICD-10-CM | POA: Diagnosis not present

## 2022-10-24 DIAGNOSIS — R188 Other ascites: Secondary | ICD-10-CM | POA: Diagnosis not present

## 2022-10-24 DIAGNOSIS — R197 Diarrhea, unspecified: Secondary | ICD-10-CM | POA: Insufficient documentation

## 2022-10-24 LAB — CBC WITH DIFFERENTIAL/PLATELET
Abs Immature Granulocytes: 0 10*3/uL (ref 0.00–0.07)
Abs Immature Granulocytes: 0 10*3/uL (ref 0.00–0.07)
Band Neutrophils: 0 %
Basophils Absolute: 0 10*3/uL (ref 0.0–0.1)
Basophils Absolute: 0 10*3/uL (ref 0.0–0.1)
Basophils Relative: 0 %
Basophils Relative: 0 %
Blasts: 0 %
Eosinophils Absolute: 0 10*3/uL (ref 0.0–0.5)
Eosinophils Absolute: 0 10*3/uL (ref 0.0–0.5)
Eosinophils Relative: 0 %
Eosinophils Relative: 0 %
HCT: 43.4 % (ref 36.0–46.0)
HCT: 42.9 % (ref 36.0–46.0)
Hemoglobin: 14.1 g/dL (ref 12.0–15.0)
Hemoglobin: 14.7 g/dL (ref 12.0–15.0)
Lymphocytes Relative: 3 %
Lymphocytes Relative: 12 %
Lymphs Abs: 0.7 10*3/uL (ref 0.7–4.0)
Lymphs Abs: 3 10*3/uL (ref 0.7–4.0)
MCH: 29.8 pg (ref 26.0–34.0)
MCH: 29.8 pg (ref 26.0–34.0)
MCHC: 32.5 g/dL (ref 30.0–36.0)
MCHC: 34.3 g/dL (ref 30.0–36.0)
MCV: 91.8 fL (ref 80.0–100.0)
MCV: 87 fL (ref 80.0–100.0)
Metamyelocytes Relative: 0 %
Monocytes Absolute: 0.5 10*3/uL (ref 0.1–1.0)
Monocytes Absolute: 1.2 10*3/uL — ABNORMAL HIGH (ref 0.1–1.0)
Monocytes Relative: 2 %
Monocytes Relative: 5 %
Myelocytes: 0 %
Neutro Abs: 22.3 10*3/uL — ABNORMAL HIGH (ref 1.7–7.7)
Neutro Abs: 20.5 10*3/uL — ABNORMAL HIGH (ref 1.7–7.7)
Neutrophils Relative %: 95 %
Neutrophils Relative %: 83 %
Other: 0 %
Platelets: 297 10*3/uL (ref 150–400)
Platelets: 293 10*3/uL (ref 150–400)
Promyelocytes Relative: 0 %
RBC: 4.73 MIL/uL (ref 3.87–5.11)
RBC: 4.93 MIL/uL (ref 3.87–5.11)
RDW: 13 % (ref 11.5–15.5)
RDW: 13.1 % (ref 11.5–15.5)
WBC: 23.5 10*3/uL — ABNORMAL HIGH (ref 4.0–10.5)
WBC: 24.7 10*3/uL — ABNORMAL HIGH (ref 4.0–10.5)
nRBC: 0 % (ref 0.0–0.2)
nRBC: 0 /100 WBC
nRBC: 0 % (ref 0.0–0.2)
nRBC: 0 /100 WBC

## 2022-10-24 LAB — COMPREHENSIVE METABOLIC PANEL
ALT: 15 U/L (ref 0–44)
ALT: 14 U/L (ref 0–44)
AST: 27 U/L (ref 15–41)
AST: 24 U/L (ref 15–41)
Albumin: 3.9 g/dL (ref 3.5–5.0)
Albumin: 4 g/dL (ref 3.5–5.0)
Alkaline Phosphatase: 77 U/L (ref 38–126)
Alkaline Phosphatase: 75 U/L (ref 38–126)
Anion gap: 14 (ref 5–15)
Anion gap: 12 (ref 5–15)
BUN: 10 mg/dL (ref 8–23)
BUN: 9 mg/dL (ref 8–23)
CO2: 21 mmol/L — ABNORMAL LOW (ref 22–32)
CO2: 27 mmol/L (ref 22–32)
Calcium: 8.9 mg/dL (ref 8.9–10.3)
Calcium: 9 mg/dL (ref 8.9–10.3)
Chloride: 87 mmol/L — ABNORMAL LOW (ref 98–111)
Chloride: 85 mmol/L — ABNORMAL LOW (ref 98–111)
Creatinine, Ser: 0.88 mg/dL (ref 0.44–1.00)
Creatinine, Ser: 0.7 mg/dL (ref 0.44–1.00)
GFR, Estimated: >60 mL/min (ref >60)
GFR, Estimated: >60 mL/min (ref >60)
Glucose, Bld: 156 mg/dL — ABNORMAL HIGH (ref 70–99)
Glucose, Bld: 154 mg/dL — ABNORMAL HIGH (ref 70–99)
Potassium: 3.7 mmol/L (ref 3.5–5.1)
Potassium: 3.8 mmol/L (ref 3.5–5.1)
Sodium: 122 mmol/L — ABNORMAL LOW (ref 135–145)
Sodium: 124 mmol/L — ABNORMAL LOW (ref 135–145)
Total Bilirubin: 0.8 mg/dL (ref 0.3–1.2)
Total Bilirubin: 0.7 mg/dL (ref 0.3–1.2)
Total Protein: 7.1 g/dL (ref 6.5–8.1)
Total Protein: 7.6 g/dL (ref 6.5–8.1)

## 2022-10-24 LAB — URINALYSIS, ROUTINE W REFLEX MICROSCOPIC
Bilirubin Urine: NEGATIVE
Glucose, UA: NEGATIVE mg/dL
Ketones, ur: NEGATIVE mg/dL
Leukocytes,Ua: NEGATIVE
Nitrite: NEGATIVE
Protein, ur: 30 mg/dL — AB
Specific Gravity, Urine: 1.018 (ref 1.005–1.030)
pH: 5 (ref 5.0–8.0)

## 2022-10-24 LAB — LIPASE, BLOOD
Lipase: 24 U/L (ref 11–51)
Lipase: 24 U/L (ref 11–51)

## 2022-10-24 LAB — PROTIME-INR
INR: 1 (ref 0.8–1.2)
Prothrombin Time: 13.5 seconds (ref 11.4–15.2)

## 2022-10-24 LAB — TYPE AND SCREEN
ABO/RH(D): O NEG
Antibody Screen: NEGATIVE

## 2022-10-24 MED ORDER — IOHEXOL 350 MG/ML SOLN
80.0000 mL | Freq: Once | INTRAVENOUS | Status: AC | PRN
  Administered 2022-10-24: 80 mL via INTRAVENOUS

## 2022-10-24 MED ORDER — SODIUM CHLORIDE 0.9 % IV BOLUS
1000.0000 mL | Freq: Once | INTRAVENOUS | Status: AC
  Administered 2022-10-24: 1000 mL via INTRAVENOUS

## 2022-10-24 MED ORDER — SODIUM CHLORIDE 0.9 % IV SOLN: INTRAVENOUS | Status: DC

## 2022-10-24 MED ORDER — ONDANSETRON 4 MG PO TBDP
4.0000 mg | ORAL_TABLET | Freq: Once | ORAL | Status: AC
  Administered 2022-10-24: 4 mg via ORAL
  Filled 2022-10-24: qty 1

## 2022-10-24 MED ORDER — PANTOPRAZOLE SODIUM 40 MG IV SOLR
40.0000 mg | Freq: Once | INTRAVENOUS | Status: AC
  Administered 2022-10-24: 40 mg via INTRAVENOUS
  Filled 2022-10-24: qty 10

## 2022-10-24 MED ORDER — CIPROFLOXACIN IN D5W 400 MG/200ML IV SOLN
400.0000 mg | Freq: Two times a day (BID) | INTRAVENOUS | Status: DC
  Administered 2022-10-25 – 2022-10-26 (×4): 400 mg via INTRAVENOUS
  Filled 2022-10-24 (×4): qty 200

## 2022-10-24 MED ORDER — METRONIDAZOLE 500 MG/100ML IV SOLN
500.0000 mg | Freq: Two times a day (BID) | INTRAVENOUS | Status: DC
  Administered 2022-10-25 (×3): 500 mg via INTRAVENOUS
  Filled 2022-10-24 (×4): qty 100

## 2022-10-24 NOTE — ED Notes (Signed)
Pt stated she is leaving. 

## 2022-10-24 NOTE — H&P (Signed)
History and Physical    Patient: Wanda Downs KZS:[id number] DOB: 12-Dec-1940 DOA: 10/24/2022 DOS: the patient was seen and examined on 10/24/2022 PCP: Velna Hatchet, MD  Patient coming from: Home  Chief Complaint:  Chief Complaint  Patient presents with   Emesis   Rectal Bleeding   HPI: [name] is a 82 y.o. female with medical history significant of celiac disease, depression, fibromyalgia, hypothyroidism, history of migraine headaches and osteopenia who presented to the ER with nausea vomiting and diarrhea for several days.  Patient has also noted coffee-ground emesis as well as bright red blood per rectum.  She describes diffuse abdominal pain.  She has history of chronic disease also.  Patient was scheduled to have endoscopy done this week at [facility].  She however has the symptoms today and came to the ER.  She is hemodynamically stable.  ER has consulted GI already.  At this point patient is being admitted to the hospital for management.  H&H appears to be stable.  Review of Systems: As mentioned in the history of present illness. All other systems reviewed and are negative. Past Medical History:  Diagnosis Date   Celiac disease    Depression    Elevated LFTs    check Hep B & C   FHx: migraine headaches    Fibromyalgia    Hypothyroidism    Osteoarthritis    Osteopenia    Past Surgical History:  Procedure Laterality Date   ABDOMINAL HYSTERECTOMY     Bilateral S & O   AUGMENTATION MAMMAPLASTY Bilateral    BLADDER SURGERY     bone spur     left knee   bone spur     hand   BREAST SURGERY     Breast implants X 2   COSMETIC SURGERY     Plastic surgery (face)   EXCISION MORTON'S NEUROMA     KNEE ARTHROSCOPY     right knee   NASAL SINUS SURGERY     right hand fx     Social History:  reports that she has quit smoking. She has never used smokeless tobacco. She reports that she does not drink alcohol and does not use drugs.  Allergies  Allergen Reactions    Amlodipine Besylate     Other Reaction(s): feels awful   Olmesartan Medoxomil     Other Reaction(s): GI celiac issues   Penicillins     REACTION: rash, swelling   Sulfamethoxazole     REACTION: rash, swelling    Family History  Problem Relation Age of Onset   Hypertension Mother    Arthritis Mother     Prior to Admission medications   Medication Sig Start Date End Date Taking? Authorizing Provider  azelastine (OPTIVAR) 0.05 % ophthalmic solution Place 2 sprays into the nose Once daily as needed. 10/08/12   [provider]  Calcium Carbonate 1500 MG TABS Take by mouth daily.      [provider]  Cholecalciferol (VITAMIN D) 1000 UNITS capsule Take 1,000 Units by mouth daily.      [provider]  estradiol (VIVELLE-DOT) 0.1 MG/24HR Place 1 patch onto the skin. Change every 4 days     [provider]  fluticasone (FLONASE) 50 MCG/ACT nasal spray Place 2 sprays into the nose daily. 03/15/12   Swords, Darrick Penna, MD  lisinopril (ZESTRIL) 10 MG tablet 1 tablet Orally Once an evening for 90 days 11/15/21   [provider]  meloxicam (MOBIC) 7.5 MG tablet  TAKE ONE TABLET EVERY DAY 03/26/12   Swords, Darrick Penna, MD  NEXIUM 40 MG capsule Take 1 capsule by mouth daily. 09/08/12   [provider]  predniSONE (DELTASONE) 5 MG tablet Take 0.5 tablets by mouth daily. 06/19/12   [provider]  SUMAtriptan (IMITREX) 100 MG tablet Take 1 tablet (100 mg total) by mouth daily as needed. Take 1/2- 1 tablet 04/19/11   Swords, Darrick Penna, MD  thyroid (ARMOUR) 60 MG tablet Take 60 mg by mouth daily. Take 1 tablet po daily of compounded 75 mg     [provider]  VOLTAREN 1 % GEL APPLY TOPICALLY TWICE DAILY AS NEEDED FOR ARTHRITIC PAIN 02/12/13   Lisabeth Pick, MD    Physical Exam: Vitals:   10/24/22 1251 10/24/22 2116  BP: (!) 143/79 126/66  Pulse: 83 78  Resp: 16 16  Temp: 98 F (36.7 C) 99.2 F (37.3 C)  TempSrc: Oral Oral  SpO2: 98%  97%   Constitutional: Acutely ill looking no distress, calm, comfortable Eyes: PERRL, lids and conjunctivae normal ENMT: Mucous membranes are moist. Posterior pharynx clear of any exudate or lesions.Normal dentition.  Neck: normal, supple, no masses, no thyromegaly Respiratory: clear to auscultation bilaterally, no wheezing, no crackles. Normal respiratory effort. No accessory muscle use.  Cardiovascular: Regular rate and rhythm, no murmurs / rubs / gallops. No extremity edema. 2+ pedal pulses. No carotid bruits.  Abdomen: no tenderness, no masses palpated. No hepatosplenomegaly. Bowel sounds positive.  Musculoskeletal: Good range of motion, no joint swelling or tenderness, Skin: no rashes, lesions, ulcers. No induration Neurologic: CN 2-12 grossly intact. Sensation intact, DTR normal. Strength 5/5 in all 4.  Psychiatric: Normal judgment and insight. Alert and oriented x 3. Normal mood  Data Reviewed:  Sodium 124, potassium 3.8, chloride 85 CO2 27 glucose 154, white count is 24.7 hemoglobin 14.7 platelets 293.  Urinalysis essentially negative.  CT angio grandma abdomen and pelvis shows no evidence of acute GI bleed.  Also aortic atherosclerosis.  There was marked colonic wall thickening from the splenic flexure through the rectum consistent with inflammatory or infectious colitis.  There is small volume ascites and small fluid containing right femoral hernia with no bowel herniation.  Assessment and Plan:  #1 GI bleed: Patient reported both upper and lower GI bleed.  H&H is stable.  Appears to have significant colitis probably flareup of her Crohn's disease versus ulcerative colitis versus infectious colitis.  Patient will be admitted.  Treat with IV Protonix.  IV antibiotics.  GI consulted.  I will keep n.p.o. for now.  #2 descending colitis: Infectious versus inflammatory.  The history of Crohn's and celiac disease.  I will empirically start patient on antibiotics pending GI evaluation.  #3  hyponatremia: Significant at 124.  Could be dietary.  SIADH likely.  Check serum osmolality.  Volume expand and monitor sodium level.  Check urine spot sodium.  #4 hypothyroidism: Continue levothyroxine once oral intake returns.  #5 depression: Confirm on resume home regimen.     Advance Care Planning:   Code Status: Full Code   Consults: [facility]  Family Communication: No family at bedside  Severity of Illness: The appropriate patient status for this patient is INPATIENT. Inpatient status is judged to be reasonable and necessary in order to provide the required intensity of service to ensure the patient's safety. The patient's presenting symptoms, physical exam findings, and initial radiographic and laboratory data in the context of their chronic comorbidities is felt to place  them at high risk for further clinical deterioration. Furthermore, it is not anticipated that the patient will be medically stable for discharge from the hospital within 2 midnights of admission.   * I certify that at the point of admission it is my clinical judgment that the patient will require inpatient hospital care spanning beyond 2 midnights from the point of admission due to high intensity of service, high risk for further deterioration and high frequency of surveillance required.*  AuthorBarbette Merino, MD 10/24/2022 9:43 PM  For on call review [email or web address].

## 2022-10-24 NOTE — ED Triage Notes (Addendum)
Patient BIB [facility] from home. Was seen this morning for the same vomiting and rectal bleeding that started a day ago. But left before completing care.

## 2022-10-24 NOTE — ED Notes (Signed)
RN called lab to run urine for sodium and osmolarity

## 2022-10-24 NOTE — ED Notes (Signed)
Pt's brother advises that he is leaving. He also advises that his sister is insisting on eating vanilla wafers. RN notified.

## 2022-10-24 NOTE — ED Triage Notes (Signed)
Pt arrived POV from home c/o nausea and diarrhea that started last night. Pt is also c/o abdominal pain. Pt states she has celiac disease.

## 2022-10-24 NOTE — ED Provider Triage Note (Signed)
Emergency Medicine Provider Triage Evaluation Note  Wanda Downs , a 82 y.o. female  was evaluated in triage.  Pt complains of nausea, vomiting, diarrhea, hematemesis, and hematochezia with generalized abdominal pain for the last 2 days. Hx Crohn's but has never had symptoms like this. She denies chest pain, SOB, dysuria, back pain, lightheadedness, dizziness, or fever. She denies taking anticoagulants. States she was here last night but left due to wait and brother made her come back today. Last EGD/colonoscopy reported to be several years ago. Denies history of alcohol abuse. Denies history of prior GI bleed.   Review of Systems  Positive: See HPI Negative: See HPI  Physical Exam  BP (!) 143/79 (BP Location: Left Arm)   Pulse 83   Temp 98 F (36.7 C) (Oral)   Resp 16   SpO2 98%  Gen:   Awake, no distress   Resp:  Normal effort, LCTA MSK:   Moves extremities without difficulty Other:  Regular rate and rhythm, Abdomen soft, diffuse tenderness to lower abdomen, no rebound, guarding, or peritoneal signs, drinking Mountain Dew on exam despite telling pt she should remain NPO, patient states she will not comply with this request despite education on importance while awaiting remainder of workup  Medical Decision Making  Medically screening exam initiated at 2:51 PM.  Appropriate orders placed.  [name] was informed that the remainder of the evaluation will be completed by another provider, this initial triage assessment does not replace that evaluation, and the importance of remaining in the ED until their evaluation is complete.     Suzzette Righter, PA-C 10/24/22 1514

## 2022-10-24 NOTE — ED Provider Notes (Addendum)
[facility] DEPT Provider Note   CSN: [id number] Arrival date & time: 10/24/22  1246     History  Chief Complaint  Patient presents with   Emesis   Rectal Bleeding    Wanda Downs is a 82 y.o. female.  This is a 82 year old female who presents with nausea vomiting and diarrhea times several days.  Patient states that she has had coffee-ground emesis as well as bright red blood per rectum.  Has had diffused abdominal discomfort.  History of Crohn's disease as well as celiac disease.  States that she was scheduled to have endoscopy done this week sometime.  Denies any history of alcohol abuse.  Does not take any blood thinners.  Does note some weakness.  No fever or chills.       Home Medications Prior to Admission medications   Medication Sig Start Date End Date Taking? Authorizing Provider  azelastine (OPTIVAR) 0.05 % ophthalmic solution Place 2 sprays into the nose Once daily as needed. 10/08/12   [provider]  Calcium Carbonate 1500 MG TABS Take by mouth daily.      [provider]  Cholecalciferol (VITAMIN D) 1000 UNITS capsule Take 1,000 Units by mouth daily.      [provider]  estradiol (VIVELLE-DOT) 0.1 MG/24HR Place 1 patch onto the skin. Change every 4 days     [provider]  fluticasone (FLONASE) 50 MCG/ACT nasal spray Place 2 sprays into the nose daily. 03/15/12   Swords, Darrick Penna, MD  lisinopril (ZESTRIL) 10 MG tablet 1 tablet Orally Once an evening for 90 days 11/15/21   [provider]  meloxicam (MOBIC) 7.5 MG tablet TAKE ONE TABLET EVERY DAY 03/26/12   Swords, Darrick Penna, MD  NEXIUM 40 MG capsule Take 1 capsule by mouth daily. 09/08/12   [provider]  predniSONE (DELTASONE) 5 MG tablet Take 0.5 tablets by mouth daily. 06/19/12   [provider]  SUMAtriptan (IMITREX) 100 MG tablet Take 1 tablet (100 mg total) by mouth daily as needed. Take 1/2- 1 tablet 04/19/11    Swords, Darrick Penna, MD  thyroid (ARMOUR) 60 MG tablet Take 60 mg by mouth daily. Take 1 tablet po daily of compounded 75 mg     [provider]  VOLTAREN 1 % GEL APPLY TOPICALLY TWICE DAILY AS NEEDED FOR ARTHRITIC PAIN 02/12/13   Swords, Darrick Penna, MD      Allergies    Amlodipine besylate, Olmesartan medoxomil, Penicillins, and Sulfamethoxazole    Review of Systems   Review of Systems  All other systems reviewed and are negative.   Physical Exam Updated Vital Signs BP 126/66 (BP Location: Left Arm)   Pulse 78   Temp 99.2 F (37.3 C) (Oral)   Resp 16   SpO2 97%  Physical Exam Vitals and nursing note reviewed. Exam conducted with a chaperone present.  Constitutional:      General: She is not in acute distress.    Appearance: Normal appearance. She is well-developed. She is not toxic-appearing.  HENT:     Head: Normocephalic and atraumatic.  Eyes:     General: Lids are normal.     Conjunctiva/sclera: Conjunctivae normal.     Pupils: Pupils are equal, round, and reactive to light.  Neck:     Thyroid: No thyroid mass.     Trachea: No tracheal deviation.  Cardiovascular:     Rate and Rhythm: Normal rate and regular rhythm.     Heart  sounds: Normal heart sounds. No murmur heard.    No gallop.  Pulmonary:     Effort: Pulmonary effort is normal. No respiratory distress.     Breath sounds: Normal breath sounds. No stridor. No decreased breath sounds, wheezing, rhonchi or rales.  Abdominal:     General: There is no distension.     Palpations: Abdomen is soft.     Tenderness: There is no abdominal tenderness. There is no rebound.  Genitourinary:    Comments: Gross blood per rectum noted. Musculoskeletal:        General: No tenderness. Normal range of motion.     Cervical back: Normal range of motion and neck supple.  Skin:    General: Skin is warm and dry.     Findings: No abrasion or rash.  Neurological:     Mental Status: She is alert and oriented to person, place, and  time. Mental status is at baseline.     GCS: GCS eye subscore is 4. GCS verbal subscore is 5. GCS motor subscore is 6.     Cranial Nerves: No cranial nerve deficit.     Sensory: No sensory deficit.     Motor: Motor function is intact.  Psychiatric:        Attention and Perception: Attention normal.        Speech: Speech normal.        Behavior: Behavior normal.     ED Results / Procedures / Treatments   Labs (all labs ordered are listed, but only abnormal results are displayed) Labs Reviewed  COMPREHENSIVE METABOLIC PANEL - Abnormal; Notable for the following components:      Result Value   Sodium 124 (*)    Chloride 85 (*)    Glucose, Bld 154 (*)    All other components within normal limits  CBC WITH DIFFERENTIAL/PLATELET - Abnormal; Notable for the following components:   WBC 24.7 (*)    Neutro Abs 20.5 (*)    Monocytes Absolute 1.2 (*)    All other components within normal limits  URINALYSIS, ROUTINE W REFLEX MICROSCOPIC - Abnormal; Notable for the following components:   Hgb urine dipstick MODERATE (*)    Protein, ur 30 (*)    Bacteria, UA RARE (*)    All other components within normal limits  PROTIME-INR  LIPASE, BLOOD  TYPE AND SCREEN  ABO/RH    EKG None  Radiology CT Angio Abd/Pel W and/or Wo Contrast  Result Date: 10/24/2022 CLINICAL DATA:  Vomiting, rectal bleeding for 1 day EXAM: CTA ABDOMEN AND PELVIS WITHOUT AND WITH CONTRAST TECHNIQUE: Multidetector CT imaging of the abdomen and pelvis was performed using the standard protocol during bolus administration of intravenous contrast. Multiplanar reconstructed images and MIPs were obtained and reviewed to evaluate the vascular anatomy. RADIATION DOSE REDUCTION: This exam was performed according to the departmental dose-optimization program which includes automated exposure control, adjustment of the mA and/or kV according to patient size and/or use of iterative reconstruction technique. CONTRAST:  12m OMNIPAQUE  IOHEXOL 350 MG/ML SOLN COMPARISON:  None Available. FINDINGS: VASCULAR Aorta: Normal caliber aorta without aneurysm, dissection, vasculitis or significant stenosis. Diffuse atherosclerosis. Celiac: Patent without evidence of aneurysm, dissection, vasculitis or significant stenosis. SMA: Patent without evidence of aneurysm, dissection, vasculitis or significant stenosis. Renals: There are 2 right renal arteries and a single left renal artery. The bilateral renal arteries are patent without evidence of aneurysm, dissection, vasculitis, or fibromuscular dysplasia. IMA: Patent without evidence of aneurysm, dissection, vasculitis or significant stenosis.  Inflow: Patent without evidence of aneurysm, dissection, vasculitis or significant stenosis. Proximal Outflow: Bilateral common femoral and visualized portions of the superficial and profunda femoral arteries are patent without evidence of aneurysm, dissection, vasculitis or significant stenosis. Mild atherosclerosis. Veins: No obvious venous abnormality within the limitations of this arterial phase study. Review of the MIP images confirms the above findings. NON-VASCULAR Lower chest: No acute pleural or parenchymal lung disease. Hepatobiliary: No focal liver abnormality is seen. No gallstones, gallbladder wall thickening, or biliary dilatation. Pancreas: Unremarkable. No pancreatic ductal dilatation or surrounding inflammatory changes. Spleen: Normal in size without focal abnormality. Adrenals/Urinary Tract: Adrenal glands are unremarkable. Kidneys are normal, without renal calculi, focal lesion, or hydronephrosis. Bladder is unremarkable. Stomach/Bowel: There is severe colonic wall thickening extending from the splenic flexure through the rectum, most compatible with inflammatory or infectious colitis. No bowel obstruction or ileus. Normal appendix right lower quadrant. There is no accumulation of contrast within the bowel lumen to suggest active gastrointestinal  bleeding. Lymphatic: No pathologic adenopathy within the abdomen or pelvis. Reproductive: Status post hysterectomy. No adnexal masses. Other: Small amount of free fluid within the lower abdomen and pelvis. There is a right femoral hernia containing a small amount of ascites as well. No bowel herniation. No free intraperitoneal gas. Musculoskeletal: No acute or destructive bony lesions. Reconstructed images demonstrate no additional findings. IMPRESSION: VASCULAR 1. No evidence of active gastrointestinal bleeding. 2.  Aortic Atherosclerosis (ICD10-I70.0). NON-VASCULAR 1. Marked colonic wall thickening from the splenic flexure through the rectum, consistent with inflammatory or infectious colitis. 2. Small volume ascites. 3. Small fluid containing right femoral hernia. No bowel herniation. Electronically Signed   By: Randa Ngo M.D.   On: 10/24/2022 16:20    Procedures Procedures    Medications Ordered in ED Medications  pantoprazole (PROTONIX) injection 40 mg (has no administration in time range)  iohexol (OMNIPAQUE) 350 MG/ML injection 80 mL (80 mLs Intravenous Contrast Given 10/24/22 1557)    ED Course/ Medical Decision Making/ A&P                             Medical Decision Making Amount and/or Complexity of Data Reviewed Radiology: ordered.  Risk Prescription drug management.   Patient with persistent nausea and vomiting here along with mild hyponatremia with sodium of 124.  This will be treated with IV fluids.  Patient has no history of colitis and CT scan confirms this per my review interpretation.  Significant leukocytosis noted on CBC as well.  She has a white count 24.7 thousand.  Due to patient's leukocytosis, patient be started on empiric antibiotics.  Patient will likely require GI consultation and will require inpatient hospitalization.  Brother is at bedside and plan was discussed with him as well.   CRITICAL CARE Performed by: Leota Jacobsen Total critical care time:  40 minutes Critical care time was exclusive of separately billable procedures and treating other patients. Critical care was necessary to treat or prevent imminent or life-threatening deterioration. Critical care was time spent personally by me on the following activities: development of treatment plan with patient and/or surrogate as well as nursing, discussions with consultants, evaluation of patient's response to treatment, examination of patient, obtaining history from patient or surrogate, ordering and performing treatments and interventions, ordering and review of laboratory studies, ordering and review of radiographic studies, pulse oximetry and re-evaluation of patient's condition.      Final Clinical Impression(s) / ED Diagnoses Final diagnoses:  None    Rx / DC Orders ED Discharge Orders     None         Lacretia Leigh, MD 10/24/22 2130    Lacretia Leigh, MD 10/24/22 2132

## 2022-10-24 NOTE — ED Provider Triage Note (Signed)
Emergency Medicine Provider Triage Evaluation Note  Wanda Downs , a 82 y.o. female  was evaluated in triage.  Pt complains of N/V/D onset yesterday morning. Symptoms associated with generalized abdominal pain. Denies known sick contacts and prior abdominal surgeries. No associated fevers, hematemesis, hematochezia. Reports hx of Celiac disease.  Review of Systems  Positive: As above Negative: As above  Physical Exam  BP (!) 145/74   Pulse 98   Temp 98.4 F (36.9 C) (Oral)   Resp 17   Ht '5\' 1"'$  (1.549 m)   Wt 52.2 kg   SpO2 97%   BMI 21.73 kg/m  Gen:   Awake, no distress   Resp:  Normal effort  MSK:   Moves extremities without difficulty  Other:  Abdomen soft, nondistended. No focal TTP noted on exam.  Medical Decision Making  Medically screening exam initiated at 4:51 AM.  Appropriate orders placed.  Wanda Downs was informed that the remainder of the evaluation will be completed by another provider, this initial triage assessment does not replace that evaluation, and the importance of remaining in the ED until their evaluation is complete.  N/V/D - pending labs and abdominal CT.   Antonietta Breach, PA-C 10/24/22 [phone]

## 2022-10-25 DIAGNOSIS — K900 Celiac disease: Secondary | ICD-10-CM | POA: Diagnosis not present

## 2022-10-25 DIAGNOSIS — E871 Hypo-osmolality and hyponatremia: Secondary | ICD-10-CM | POA: Diagnosis not present

## 2022-10-25 DIAGNOSIS — K529 Noninfective gastroenteritis and colitis, unspecified: Secondary | ICD-10-CM | POA: Diagnosis not present

## 2022-10-25 DIAGNOSIS — K921 Melena: Secondary | ICD-10-CM | POA: Diagnosis not present

## 2022-10-25 LAB — BASIC METABOLIC PANEL
Anion gap: 8 (ref 5–15)
BUN: 7 mg/dL — ABNORMAL LOW (ref 8–23)
CO2: 24 mmol/L (ref 22–32)
Calcium: 8.3 mg/dL — ABNORMAL LOW (ref 8.9–10.3)
Chloride: 101 mmol/L (ref 98–111)
Creatinine, Ser: 0.59 mg/dL (ref 0.44–1.00)
GFR, Estimated: >60 mL/min (ref >60)
Glucose, Bld: 107 mg/dL — ABNORMAL HIGH (ref 70–99)
Potassium: 3.5 mmol/L (ref 3.5–5.1)
Sodium: 133 mmol/L — ABNORMAL LOW (ref 135–145)

## 2022-10-25 LAB — CBC
HCT: 38.9 % (ref 36.0–46.0)
Hemoglobin: 12.5 g/dL (ref 12.0–15.0)
MCH: 30.5 pg (ref 26.0–34.0)
MCHC: 32.1 g/dL (ref 30.0–36.0)
MCV: 94.9 fL (ref 80.0–100.0)
Platelets: 196 10*3/uL (ref 150–400)
RBC: 4.1 MIL/uL (ref 3.87–5.11)
RDW: 13.4 % (ref 11.5–15.5)
WBC: 17.2 10*3/uL — ABNORMAL HIGH (ref 4.0–10.5)
nRBC: 0 % (ref 0.0–0.2)

## 2022-10-25 LAB — ABO/RH: ABO/RH(D): O NEG

## 2022-10-25 LAB — SODIUM, URINE, RANDOM: Sodium, Ur: <10 mmol/L

## 2022-10-25 LAB — OSMOLALITY: Osmolality: 269 mOsm/kg — ABNORMAL LOW (ref 275–295)

## 2022-10-25 MED ORDER — PANTOPRAZOLE SODIUM 40 MG IV SOLR
40.0000 mg | Freq: Two times a day (BID) | INTRAVENOUS | Status: DC
  Administered 2022-10-25 – 2022-10-26 (×3): 40 mg via INTRAVENOUS
  Filled 2022-10-25 (×3): qty 10

## 2022-10-25 MED ORDER — ONDANSETRON HCL 4 MG PO TABS: 4.0000 mg | ORAL_TABLET | Freq: Four times a day (QID) | ORAL | Status: DC | PRN

## 2022-10-25 MED ORDER — PANTOPRAZOLE SODIUM 40 MG IV SOLR: 40.0000 mg | Freq: Two times a day (BID) | INTRAVENOUS | Status: DC

## 2022-10-25 MED ORDER — DEXTROSE-NACL 5-0.9 % IV SOLN: INTRAVENOUS | Status: DC

## 2022-10-25 MED ORDER — NONFORMULARY OR COMPOUNDED ITEM: 1.0000 | Freq: Every day | Status: DC

## 2022-10-25 MED ORDER — ONDANSETRON HCL 4 MG/2ML IJ SOLN
4.0000 mg | Freq: Four times a day (QID) | INTRAMUSCULAR | Status: DC | PRN
  Administered 2022-10-26: 4 mg via INTRAVENOUS
  Filled 2022-10-25: qty 2

## 2022-10-25 MED ORDER — THYROID 60 MG PO TABS: 60.0000 mg | ORAL_TABLET | Freq: Every day | ORAL | Status: DC

## 2022-10-25 NOTE — Progress Notes (Signed)
  Progress Note   Patient: Wanda Downs RNH:[id number] DOB: 01-13-1941 DOA: 10/24/2022     1 DOS: the patient was seen and examined on 10/25/2022 at 10:05AM and 4:30PM      Brief hospital course: Mrs. Wanda Downs is an 82 y.o. F with hypothyroidism, fibromyalgia and migraines, and Celiac disease who presented with coffee-ground emesis, bright red blood per rectum and abdominal pain.    1/15: Admitted on PPI, Hgb stable, GI consulted     Assessment and Plan: * Hematochezia Time course reportedly started "several weeks ago with just a little bit" got significantly worse in last 24 hours, with frequent red bloody BMs.  Differential includes infectious, ischemic, inflammatory colitis, probably also hemorrhoids+viral gastroenteritis.  Patient appears to have some memory loss, I suspect she is not a reliable historian. - Continue PPI  - Continue IV fluids - Continue clears - COnsult GI, appreciate cares - Obtain Cdiff and GI pathogen panel - Continue Cipro/Flagyl for now    Acute colitis See abve  Hyponatremia Na 120s.  No recent baseline.  Asymptomatic. Urine sodium low, probably dehydrated due to diarrhea.  No risk factors for ODS. - Continue IV fluids - Repeat Na   Celiac disease - Consult GI  Hypothyroidism - Check TSH - Continue Armour          Subjective: Still having bloody BMs thooughout the day.  No fever, no abdominal pain, no vomiting, no nausea.     Physical Exam: BP 126/73   Pulse 85   Temp 98 F (36.7 C) (Oral)   Resp 17   SpO2 96%   Thin elderly female, lying in bed, no acute distress Attention normal, interactive and appropriate, memory seems impaired, face symmetric, speech fluent, moves all extremities with normal strength and coordination RRR, no murmurs, no peripheral edema Respiratory rate normal, lungs clear without rales or wheezes Abdomen with some tenderness palpation   Data Reviewed: CT angiogram of the abdomen report  reviewed, unremarkable CBC shows stable hemoglobin Basic metabolic panel shows hyponatremia, normal renal function  Family Communication: Brother by phone    Disposition: Status is: Inpatient The patient will need repeat labs, trend overnight, stool studies  If stool studies are unremarkable, GI do not plan procedure, and her persistent bloody stools resolved, likely home tomorrow        Author: Edwin Dada, MD 10/25/2022 4:46 PM  For on call review [email or web address].

## 2022-10-25 NOTE — Assessment & Plan Note (Signed)
See abve

## 2022-10-25 NOTE — Assessment & Plan Note (Signed)
Time course reportedly started "several weeks ago with just a little bit" got significantly worse in last 24 hours, with frequent red bloody BMs.  Differential includes infectious, ischemic, inflammatory colitis, probably also hemorrhoids+viral gastroenteritis.  Patient appears to have some memory loss, I suspect she is not a reliable historian. - Continue PPI  - Continue IV fluids - Continue clears - COnsult GI, appreciate cares - Obtain Cdiff and GI pathogen panel - Continue Cipro/Flagyl for now

## 2022-10-25 NOTE — Consult Note (Signed)
Referring Provider: [facility] Primary Care Physician:  Velna Hatchet, MD Primary Gastroenterologist: Rogene Houston  Reason for Consultation: GI bleed  HPI: Wanda Downs is a 82 y.o. female with past medical history of nondysplastic Barrett's esophagus, IBS-D, celiac disease, fibromyalgia, migraine, hypothyroidism,presented to the hospital with nausea, vomiting and diarrhea for several days duration.  She was also complaining of rectal bleeding diffuse abdominal pain.  GI is consulted for further evaluation.  Looks like she was scheduled for outpatient endoscopy at [facility] this week for Barrett's surveillance.  Patient seen and examined at bedside.  She is complaining of intermittent rectal bleeding for last 1 month.  2 days ago she started having worsening diarrhea followed by worsening rectal bleeding.  Subsequently started noticing nausea and vomiting.  Presented to the hospital.  Upon initial evaluation, she was found to have leukocytosis with white count around 23.5 and normal hemoglobin.  CT angio yesterday showed no evidence of active bleeding but showed mild colonic wall thickening from the splenic flexure all the way towards the rectum.  Last episode of bleeding and vomiting was yesterday.    EGD 02/05/2020: IMPRESSIONS:  1. There was Barrett's esophagus found in the distal esophagus ([place] C1 M2); multiple biopsies were performed 2. Retroflexion was performed in the stomach and revealed a hiatal hernia RECOMMENDATIONS:  1. Continue current medication 2. Avoid NSAIDS PATHOLOGY:  1. Squamocolumnar mucosa with chronic inflammation and intestinal metaplasia (Barrett's esophagus). 2. Negative for dysplasia.   Colonoscopy 08/05/2014: - Mild diverticulosis noted in the sigmoid colon, otherwise normal      Past Medical History:  Diagnosis Date   Celiac disease    Depression    Elevated LFTs    check Hep B & C   FHx: migraine headaches    Fibromyalgia     Hypothyroidism    Osteoarthritis    Osteopenia     Past Surgical History:  Procedure Laterality Date   ABDOMINAL HYSTERECTOMY     Bilateral S & O   AUGMENTATION MAMMAPLASTY Bilateral    BLADDER SURGERY     bone spur     left knee   bone spur     hand   BREAST SURGERY     Breast implants X 2   COSMETIC SURGERY     Plastic surgery (face)   EXCISION MORTON'S NEUROMA     KNEE ARTHROSCOPY     right knee   NASAL SINUS SURGERY     right hand fx      Prior to Admission medications   Medication Sig Start Date End Date Taking? Authorizing Provider  azelastine (OPTIVAR) 0.05 % ophthalmic solution Place 2 sprays into the nose Once daily as needed. 10/08/12   [provider]  Calcium Carbonate 1500 MG TABS Take by mouth daily.      [provider]  Cholecalciferol (VITAMIN D) 1000 UNITS capsule Take 1,000 Units by mouth daily.      [provider]  estradiol (VIVELLE-DOT) 0.1 MG/24HR Place 1 patch onto the skin. Change every 4 days     [provider]  fluticasone (FLONASE) 50 MCG/ACT nasal spray Place 2 sprays into the nose daily. 03/15/12   Swords, Darrick Penna, MD  lisinopril (ZESTRIL) 10 MG tablet 1 tablet Orally Once an evening for 90 days 11/15/21   [provider]  meloxicam (MOBIC) 7.5 MG tablet TAKE ONE TABLET EVERY DAY 03/26/12   Swords, Darrick Penna, MD  NEXIUM 40 MG capsule Take 1 capsule by mouth daily.  09/08/12   [provider]  predniSONE (DELTASONE) 5 MG tablet Take 0.5 tablets by mouth daily. 06/19/12   [provider]  SUMAtriptan (IMITREX) 100 MG tablet Take 1 tablet (100 mg total) by mouth daily as needed. Take 1/2- 1 tablet 04/19/11   Swords, Darrick Penna, MD  thyroid (ARMOUR) 60 MG tablet Take 60 mg by mouth daily. Take 1 tablet po daily of compounded 75 mg     [provider]  VOLTAREN 1 % GEL APPLY TOPICALLY TWICE DAILY AS NEEDED FOR ARTHRITIC PAIN 02/12/13   Swords, Darrick Penna, MD    Scheduled Meds:  pantoprazole  (PROTONIX) IV  40 mg Intravenous Q12H   Continuous Infusions:  sodium chloride 125 mL/hr at 10/24/22 2146   ciprofloxacin Stopped (10/25/22 0210)   metronidazole Stopped (10/25/22 0101)   PRN Meds:.  Allergies as of 10/24/2022 - Review Complete 10/24/2022  Allergen Reaction Noted   Amlodipine besylate  08/23/2022   Olmesartan medoxomil  08/23/2022   Penicillins     Sulfamethoxazole      Family History  Problem Relation Age of Onset   Hypertension Mother    Arthritis Mother     Social History   Socioeconomic History   Marital status: Single    Spouse name: Not on file   Number of children: Not on file   Years of education: Not on file   Highest education level: Not on file  Occupational History   Not on file  Tobacco Use   Smoking status: Former   Smokeless tobacco: Never  Substance and Sexual Activity   Alcohol use: No   Drug use: No   Sexual activity: Not on file  Other Topics Concern   Not on file  Social History Narrative   Not on file   Social Determinants of Health   Financial Resource Strain: Not on file  Food Insecurity: Not on file  Transportation Needs: Not on file  Physical Activity: Not on file  Stress: Not on file  Social Connections: Not on file  Intimate Partner Violence: Not on file    Review of Systems: All negative except as stated above in HPI.  Physical Exam: Vital signs: Vitals:   10/25/22 0700 10/25/22 0830  BP: 108/60 124/88  Pulse: 66 74  Resp: 14 13  Temp: 98.6 F (37 C)   SpO2: 96% 96%     General:   Alert,  Well-developed, well-nourished, pleasant and cooperative in NAD HEENT -normocephalic, atraumatic, extraocular movement intact Lungs: No visible respiratory distress Heart:  Regular rate and rhythm; no murmurs, clicks, rubs,  or gallops. Abdomen: Soft, mildly distended, nontender, bowel sounds present, no peritoneal signs Rectal:  Deferred  GI:  Lab Results: Recent Labs    10/24/22 0444 10/24/22 1510  WBC  23.5* 24.7*  HGB 14.1 14.7  HCT 43.4 42.9  PLT 297 293   BMET Recent Labs    10/24/22 0444 10/24/22 1510  NA 122* 124*  K 3.7 3.8  CL 87* 85*  CO2 21* 27  GLUCOSE 156* 154*  BUN 10 9  CREATININE 0.88 0.70  CALCIUM 8.9 9.0   LFT Recent Labs    10/24/22 1510  PROT 7.6  ALBUMIN 4.0  AST 24  ALT 14  ALKPHOS 75  BILITOT 0.7   PT/INR Recent Labs    10/24/22 1510  LABPROT 13.5  INR 1.0     Studies/Results: CT Angio Abd/Pel W and/or Wo Contrast  Result Date: 10/24/2022 CLINICAL DATA:  Vomiting, rectal  bleeding for 1 day EXAM: CTA ABDOMEN AND PELVIS WITHOUT AND WITH CONTRAST TECHNIQUE: Multidetector CT imaging of the abdomen and pelvis was performed using the standard protocol during bolus administration of intravenous contrast. Multiplanar reconstructed images and MIPs were obtained and reviewed to evaluate the vascular anatomy. RADIATION DOSE REDUCTION: This exam was performed according to the departmental dose-optimization program which includes automated exposure control, adjustment of the mA and/or kV according to patient size and/or use of iterative reconstruction technique. CONTRAST:  73m OMNIPAQUE IOHEXOL 350 MG/ML SOLN COMPARISON:  None Available. FINDINGS: VASCULAR Aorta: Normal caliber aorta without aneurysm, dissection, vasculitis or significant stenosis. Diffuse atherosclerosis. Celiac: Patent without evidence of aneurysm, dissection, vasculitis or significant stenosis. SMA: Patent without evidence of aneurysm, dissection, vasculitis or significant stenosis. Renals: There are 2 right renal arteries and a single left renal artery. The bilateral renal arteries are patent without evidence of aneurysm, dissection, vasculitis, or fibromuscular dysplasia. IMA: Patent without evidence of aneurysm, dissection, vasculitis or significant stenosis. Inflow: Patent without evidence of aneurysm, dissection, vasculitis or significant stenosis. Proximal Outflow: Bilateral common  femoral and visualized portions of the superficial and profunda femoral arteries are patent without evidence of aneurysm, dissection, vasculitis or significant stenosis. Mild atherosclerosis. Veins: No obvious venous abnormality within the limitations of this arterial phase study. Review of the MIP images confirms the above findings. NON-VASCULAR Lower chest: No acute pleural or parenchymal lung disease. Hepatobiliary: No focal liver abnormality is seen. No gallstones, gallbladder wall thickening, or biliary dilatation. Pancreas: Unremarkable. No pancreatic ductal dilatation or surrounding inflammatory changes. Spleen: Normal in size without focal abnormality. Adrenals/Urinary Tract: Adrenal glands are unremarkable. Kidneys are normal, without renal calculi, focal lesion, or hydronephrosis. Bladder is unremarkable. Stomach/Bowel: There is severe colonic wall thickening extending from the splenic flexure through the rectum, most compatible with inflammatory or infectious colitis. No bowel obstruction or ileus. Normal appendix right lower quadrant. There is no accumulation of contrast within the bowel lumen to suggest active gastrointestinal bleeding. Lymphatic: No pathologic adenopathy within the abdomen or pelvis. Reproductive: Status post hysterectomy. No adnexal masses. Other: Small amount of free fluid within the lower abdomen and pelvis. There is a right femoral hernia containing a small amount of ascites as well. No bowel herniation. No free intraperitoneal gas. Musculoskeletal: No acute or destructive bony lesions. Reconstructed images demonstrate no additional findings. IMPRESSION: VASCULAR 1. No evidence of active gastrointestinal bleeding. 2.  Aortic Atherosclerosis (ICD10-I70.0). NON-VASCULAR 1. Marked colonic wall thickening from the splenic flexure through the rectum, consistent with inflammatory or infectious colitis. 2. Small volume ascites. 3. Small fluid containing right femoral hernia. No bowel  herniation. Electronically Signed   By: MRanda NgoM.D.   On: 10/24/2022 16:20    Impression/Plan: -Rectal bleeding in setting of nausea vomiting and diarrhea.  CT scan showed thickening of the colon from splenic flexure towards the rectum.  Normal hemoglobin but significant leukocytosis.  Overall finding concerning for infectious colitis. -History of IBS-D -History of celiac disease -Non Dysplastic Barrett's on last EGD in 2021.  Recommendations ------------------------- -Agree with GI pathogen panel  -Also recommend C. Difficile given significant leukocytosis. -Looks like she has already been started on ciprofloxacin and Flagyl. -No Plan for inpatient endoscopic procedure at this time.-Okay to have full liquid diet from GI standpoint. -GI will follow.    LOS: 1 day   POtis Brace MD, FACP 10/25/2022, 9:40 AM  Contact #  [phone]

## 2022-10-25 NOTE — Assessment & Plan Note (Signed)
-  Check TSH - Continue Armour

## 2022-10-25 NOTE — Assessment & Plan Note (Signed)
Na 120s.  No recent baseline.  Asymptomatic. Urine sodium low, probably dehydrated due to diarrhea.  No risk factors for ODS. - Continue IV fluids - Repeat Na

## 2022-10-25 NOTE — ED Notes (Signed)
ED TO INPATIENT HANDOFF REPORT  ED Nurse Name and Phone #: Danise Edge Name/Age/Gender Wanda Downs 82 y.o. female Room/Bed: WA20/WA20  Code Status   Code Status: Full Code  Home/SNF/Other Home Patient oriented to: self, place, time, and situation Is this baseline? Yes   Triage Complete: Triage complete  Chief Complaint GI bleed [K92.2]  Triage Note Patient BIB [facility] from home. Was seen this morning for the same vomiting and rectal bleeding that started a day ago. But left before completing care.    Allergies Allergies  Allergen Reactions   Amlodipine Besylate     Other Reaction(s): feels awful   Olmesartan Medoxomil     Other Reaction(s): GI celiac issues   Penicillins     REACTION: rash, swelling   Sulfamethoxazole     REACTION: rash, swelling    Level of Care/Admitting Diagnosis ED Disposition     ED Disposition  Admit   Condition  --   Comment  Hospital Area: [facility] [[id number]]  Level of Care: Telemetry [5]  Admit to tele based on following criteria: Complex arrhythmia (Bradycardia/Tachycardia)  May admit patient to [facility] or [facility] if equivalent level of care is available:: Yes  Covid Evaluation: Asymptomatic - no recent exposure (last 10 days) testing not required  Diagnosis: GI bleed [284132]  Admitting Physician: Elwyn Reach [place]  Attending Physician: Elwyn Reach [[id number]]  Certification:: I certify this patient will need inpatient services for at least 2 midnights  Estimated Length of Stay: 4          B Medical/Surgery History Past Medical History:  Diagnosis Date   Celiac disease    Depression    Elevated LFTs    check Hep B & C   FHx: migraine headaches    Fibromyalgia    Hypothyroidism    Osteoarthritis    Osteopenia    Past Surgical History:  Procedure Laterality Date   ABDOMINAL HYSTERECTOMY     Bilateral S & O   AUGMENTATION MAMMAPLASTY Bilateral    BLADDER SURGERY     bone  spur     left knee   bone spur     hand   BREAST SURGERY     Breast implants X 2   COSMETIC SURGERY     Plastic surgery (face)   EXCISION MORTON'S NEUROMA     KNEE ARTHROSCOPY     right knee   NASAL SINUS SURGERY     right hand fx       A IV Location/Drains/Wounds Patient Lines/Drains/Airways Status     Active Line/Drains/Airways     Name Placement date Placement time Site Days   Peripheral IV 10/24/22 20 G Right Antecubital 10/24/22  1557  Antecubital  1            Intake/Output Last 24 hours No intake or output data in the 24 hours ending 10/25/22 1454  Labs/Imaging Results for orders placed or performed during the hospital encounter of 10/24/22 (from the past 48 hour(s))  Urinalysis, Routine w reflex microscopic Urine, Clean Catch     Status: Abnormal   Collection Time: 10/24/22  2:52 PM  Result Value Ref Range   Color, Urine YELLOW YELLOW   APPearance CLEAR CLEAR   Specific Gravity, Urine 1.018 1.005 - 1.030   pH 5.0 5.0 - 8.0   Glucose, UA NEGATIVE NEGATIVE mg/dL   Hgb urine dipstick MODERATE (A) NEGATIVE   Bilirubin Urine NEGATIVE NEGATIVE  Ketones, ur NEGATIVE NEGATIVE mg/dL   Protein, ur 30 (A) NEGATIVE mg/dL   Nitrite NEGATIVE NEGATIVE   Leukocytes,Ua NEGATIVE NEGATIVE   RBC / HPF 0-5 0 - 5 RBC/hpf   WBC, UA 0-5 0 - 5 WBC/hpf   Bacteria, UA RARE (A) NONE SEEN   Squamous Epithelial / HPF 0-5 0 - 5 /HPF   Mucus PRESENT    Hyaline Casts, UA PRESENT    Cellular Cast, UA PRESENT     Comment: Performed at [facility], [place] [address], [place], [place] [address]  Sodium, urine, random     Status: None   Collection Time: 10/24/22  2:52 PM  Result Value Ref Range   Sodium, Ur <10 mmol/L    Comment: Performed at [facility], [place] [address], [place], [place] [address]  Comprehensive metabolic panel     Status: Abnormal   Collection Time: 10/24/22  3:10 PM  Result Value Ref Range   Sodium 124 (L) 135 - 145 mmol/L    Potassium 3.8 3.5 - 5.1 mmol/L   Chloride 85 (L) 98 - 111 mmol/L   CO2 27 22 - 32 mmol/L   Glucose, Bld 154 (H) 70 - 99 mg/dL    Comment: Glucose reference range applies only to samples taken after fasting for at least 8 hours.   BUN 9 8 - 23 mg/dL   Creatinine, Ser 0.70 0.44 - 1.00 mg/dL   Calcium 9.0 8.9 - 10.3 mg/dL   Total Protein 7.6 6.5 - 8.1 g/dL   Albumin 4.0 3.5 - 5.0 g/dL   AST 24 15 - 41 U/L   ALT 14 0 - 44 U/L   Alkaline Phosphatase 75 38 - 126 U/L   Total Bilirubin 0.7 0.3 - 1.2 mg/dL   GFR, Estimated >60 >60 mL/min    Comment: (NOTE) Calculated using the CKD-EPI Creatinine Equation (2021)    Anion gap 12 5 - 15    Comment: Performed at [facility], [place] [address] [place], [place] [address]  CBC with Differential     Status: Abnormal   Collection Time: 10/24/22  3:10 PM  Result Value Ref Range   WBC 24.7 (H) 4.0 - 10.5 K/uL   RBC 4.93 3.87 - 5.11 MIL/uL   Hemoglobin 14.7 12.0 - 15.0 g/dL   HCT 42.9 36.0 - 46.0 %   MCV 87.0 80.0 - 100.0 fL   MCH 29.8 26.0 - 34.0 pg   MCHC 34.3 30.0 - 36.0 g/dL   RDW 13.1 11.5 - 15.5 %   Platelets 293 150 - 400 K/uL   nRBC 0.0 0.0 - 0.2 %   Neutrophils Relative % 83 %   Lymphocytes Relative 12 %   Monocytes Relative 5 %   Eosinophils Relative 0 %   Basophils Relative 0 %   Band Neutrophils 0 %   Metamyelocytes Relative 0 %   Myelocytes 0 %   Promyelocytes Relative 0 %   Blasts 0 %   nRBC 0 0 /100 WBC   Other 0 %   Neutro Abs 20.5 (H) 1.7 - 7.7 K/uL   Lymphs Abs 3.0 0.7 - 4.0 K/uL   Monocytes Absolute 1.2 (H) 0.1 - 1.0 K/uL   Eosinophils Absolute 0.0 0.0 - 0.5 K/uL   Basophils Absolute 0.0 0.0 - 0.1 K/uL   Abs Immature Granulocytes 0.00 0.00 - 0.07 K/uL   Smear Review MORPHOLOGY UNREMARKABLE     Comment: Performed at [facility],  [place] [address], [place], [place] [address]  Protime-INR     Status: None   Collection Time: 10/24/22  3:10 PM  Result Value Ref Range   Prothrombin  Time 13.5 11.4 - 15.2 seconds   INR 1.0 0.8 - 1.2    Comment: (NOTE) INR goal varies based on device and disease states. Performed at [facility], [place] [address], [place], [place] [address]   Type and screen [place]     Status: None   Collection Time: 10/24/22  3:10 PM  Result Value Ref Range   ABO/RH(D) O NEG    Antibody Screen NEG    Sample Expiration      10/27/2022,2359 Performed at [facility], [place] [address] [place], [place] [address]   Lipase, blood     Status: None   Collection Time: 10/24/22  3:10 PM  Result Value Ref Range   Lipase 24 11 - 51 U/L    Comment: Performed at [facility], [place] [address] [place], [place] [address]  Osmolality     Status: Abnormal   Collection Time: 10/25/22 12:00 AM  Result Value Ref Range   Osmolality 269 (L) 275 - 295 mOsm/kg    Comment: REPEATED TO VERIFY Performed at [facility], [place], [place] [address]    CT Angio Abd/Pel W and/or Wo Contrast  Result Date: 10/24/2022 CLINICAL DATA:  Vomiting, rectal bleeding for 1 day EXAM: CTA ABDOMEN AND PELVIS WITHOUT AND WITH CONTRAST TECHNIQUE: Multidetector CT imaging of the abdomen and pelvis was performed using the standard protocol during bolus administration of intravenous contrast. Multiplanar reconstructed images and MIPs were obtained and reviewed to evaluate the vascular anatomy. RADIATION DOSE REDUCTION: This exam was performed according to the departmental dose-optimization program which includes automated exposure control, adjustment of the mA and/or kV according to patient size and/or use of iterative reconstruction technique. CONTRAST:  68m OMNIPAQUE IOHEXOL 350 MG/ML SOLN COMPARISON:  None Available. FINDINGS: VASCULAR Aorta: Normal caliber aorta without aneurysm, dissection, vasculitis or significant stenosis. Diffuse atherosclerosis. Celiac: Patent without evidence of  aneurysm, dissection, vasculitis or significant stenosis. SMA: Patent without evidence of aneurysm, dissection, vasculitis or significant stenosis. Renals: There are 2 right renal arteries and a single left renal artery. The bilateral renal arteries are patent without evidence of aneurysm, dissection, vasculitis, or fibromuscular dysplasia. IMA: Patent without evidence of aneurysm, dissection, vasculitis or significant stenosis. Inflow: Patent without evidence of aneurysm, dissection, vasculitis or significant stenosis. Proximal Outflow: Bilateral common femoral and visualized portions of the superficial and profunda femoral arteries are patent without evidence of aneurysm, dissection, vasculitis or significant stenosis. Mild atherosclerosis. Veins: No obvious venous abnormality within the limitations of this arterial phase study. Review of the MIP images confirms the above findings. NON-VASCULAR Lower chest: No acute pleural or parenchymal lung disease. Hepatobiliary: No focal liver abnormality is seen. No gallstones, gallbladder wall thickening, or biliary dilatation. Pancreas: Unremarkable. No pancreatic ductal dilatation or surrounding inflammatory changes. Spleen: Normal in size without focal abnormality. Adrenals/Urinary Tract: Adrenal glands are unremarkable. Kidneys are normal, without renal calculi, focal lesion, or hydronephrosis. Bladder is unremarkable. Stomach/Bowel: There is severe colonic wall thickening extending from the splenic flexure through the rectum, most compatible with inflammatory or infectious colitis. No bowel obstruction or ileus. Normal appendix right lower quadrant. There is no accumulation of contrast within the bowel lumen to suggest active gastrointestinal bleeding. Lymphatic: No pathologic adenopathy within the abdomen or pelvis. Reproductive:  Status post hysterectomy. No adnexal masses. Other: Small amount of free fluid within the lower abdomen and pelvis. There is a right femoral  hernia containing a small amount of ascites as well. No bowel herniation. No free intraperitoneal gas. Musculoskeletal: No acute or destructive bony lesions. Reconstructed images demonstrate no additional findings. IMPRESSION: VASCULAR 1. No evidence of active gastrointestinal bleeding. 2.  Aortic Atherosclerosis (ICD10-I70.0). NON-VASCULAR 1. Marked colonic wall thickening from the splenic flexure through the rectum, consistent with inflammatory or infectious colitis. 2. Small volume ascites. 3. Small fluid containing right femoral hernia. No bowel herniation. Electronically Signed   By: Randa Ngo M.D.   On: 10/24/2022 16:20    Pending Labs Unresulted Labs (From admission, onward)     Start     Ordered   10/25/22 1035  C Difficile Quick Screen w PCR reflex  (C Difficile quick screen w PCR reflex panel )  Once, for 24 hours,   TIMED       References:    CDiff Information Tool   10/25/22 1034   10/25/22 0809  Gastrointestinal Panel by PCR , Stool  (Gastrointestinal Panel by PCR, Stool                                                                                                                                                     **Does Not include CLOSTRIDIUM DIFFICILE testing. **If CDIFF testing is needed, place order from the "C Difficile Testing" order set.**)  Once,   R        10/25/22 0808   10/24/22 2354  Osmolality, urine  Once,   R        10/24/22 2353   10/24/22 1827  ABO/Rh  Once,   R        10/24/22 1827   Signed and Held  Comprehensive metabolic panel  Tomorrow morning,   R        Signed and Held            Vitals/Pain Today's Vitals   10/25/22 1145 10/25/22 1146 10/25/22 1300 10/25/22 1330  BP:  (!) 75/36 102/80 126/73  Pulse:  65 68 85  Resp:  '18 20 17  '$ Temp: 98.6 F (37 C) 98 F (36.7 C)    TempSrc: Oral Oral    SpO2:  97% 96% 96%  PainSc:        Isolation Precautions Enteric precautions (UV disinfection)  Medications Medications  0.9 %  sodium chloride  infusion ( Intravenous New Bag/Given 10/24/22 2146)  ondansetron (ZOFRAN) tablet 4 mg (has no administration in time range)    Or  ondansetron (ZOFRAN) injection 4 mg (has no administration in time range)  dextrose 5 %-0.9 % sodium chloride infusion ( Intravenous New Bag/Given 10/25/22 1230)  metroNIDAZOLE (FLAGYL) IVPB 500 mg (500 mg Intravenous New Bag/Given 10/25/22 1149)  ciprofloxacin (CIPRO) IVPB 400 mg (400 mg Intravenous New Bag/Given 10/25/22 1148)  pantoprazole (PROTONIX) injection 40 mg (40 mg Intravenous Given 10/25/22 1058)  iohexol (OMNIPAQUE) 350 MG/ML injection 80 mL (80 mLs Intravenous Contrast Given 10/24/22 1557)  pantoprazole (PROTONIX) injection 40 mg (40 mg Intravenous Given 10/24/22 2145)  sodium chloride 0.9 % bolus 1,000 mL (0 mLs Intravenous Stopped 10/24/22 2354)    Mobility walks     Focused Assessments     R Recommendations: See Admitting Provider Note  Report given to:   Additional Notes:

## 2022-10-25 NOTE — Assessment & Plan Note (Signed)
-  Consult GI

## 2022-10-25 NOTE — Hospital Course (Signed)
Wanda Downs is an 82 y.o. F with hypothyroidism, fibromyalgia and migraines, and Celiac disease who presented with coffee-ground emesis, bright red blood per rectum and abdominal pain.    1/15: Admitted on PPI, Hgb stable, GI consulted

## 2022-10-26 DIAGNOSIS — K921 Melena: Secondary | ICD-10-CM | POA: Diagnosis not present

## 2022-10-26 DIAGNOSIS — E871 Hypo-osmolality and hyponatremia: Secondary | ICD-10-CM | POA: Diagnosis not present

## 2022-10-26 DIAGNOSIS — K900 Celiac disease: Secondary | ICD-10-CM | POA: Diagnosis not present

## 2022-10-26 DIAGNOSIS — K529 Noninfective gastroenteritis and colitis, unspecified: Secondary | ICD-10-CM | POA: Diagnosis not present

## 2022-10-26 LAB — COMPREHENSIVE METABOLIC PANEL
ALT: 11 U/L (ref 0–44)
AST: 17 U/L (ref 15–41)
Albumin: 2.6 g/dL — ABNORMAL LOW (ref 3.5–5.0)
Alkaline Phosphatase: 40 U/L (ref 38–126)
Anion gap: 5 (ref 5–15)
BUN: <5 mg/dL — ABNORMAL LOW (ref 8–23)
CO2: 25 mmol/L (ref 22–32)
Calcium: 7.7 mg/dL — ABNORMAL LOW (ref 8.9–10.3)
Chloride: 106 mmol/L (ref 98–111)
Creatinine, Ser: 0.7 mg/dL (ref 0.44–1.00)
GFR, Estimated: >60 mL/min (ref >60)
Glucose, Bld: 112 mg/dL — ABNORMAL HIGH (ref 70–99)
Potassium: 3.3 mmol/L — ABNORMAL LOW (ref 3.5–5.1)
Sodium: 136 mmol/L (ref 135–145)
Total Bilirubin: 0.4 mg/dL (ref 0.3–1.2)
Total Protein: 5.1 g/dL — ABNORMAL LOW (ref 6.5–8.1)

## 2022-10-26 LAB — CBC
HCT: 33.1 % — ABNORMAL LOW (ref 36.0–46.0)
Hemoglobin: 11 g/dL — ABNORMAL LOW (ref 12.0–15.0)
MCH: 30.1 pg (ref 26.0–34.0)
MCHC: 33.2 g/dL (ref 30.0–36.0)
MCV: 90.7 fL (ref 80.0–100.0)
Platelets: 192 10*3/uL (ref 150–400)
RBC: 3.65 MIL/uL — ABNORMAL LOW (ref 3.87–5.11)
RDW: 13.5 % (ref 11.5–15.5)
WBC: 14.1 10*3/uL — ABNORMAL HIGH (ref 4.0–10.5)
nRBC: 0 % (ref 0.0–0.2)

## 2022-10-26 LAB — OSMOLALITY, URINE: Osmolality, Ur: 574 mOsm/kg (ref 300–900)

## 2022-10-26 LAB — TSH: TSH: 4.736 u[IU]/mL — ABNORMAL HIGH (ref 0.350–4.500)

## 2022-10-26 MED ORDER — CIPROFLOXACIN HCL 500 MG PO TABS: 500.0000 mg | ORAL_TABLET | Freq: Two times a day (BID) | ORAL | 0 refills | Status: AC

## 2022-10-26 MED ORDER — POTASSIUM CHLORIDE CRYS ER 20 MEQ PO TBCR
40.0000 meq | EXTENDED_RELEASE_TABLET | Freq: Once | ORAL | Status: AC
  Administered 2022-10-26: 40 meq via ORAL
  Filled 2022-10-26: qty 2

## 2022-10-26 MED ORDER — METRONIDAZOLE 500 MG PO TABS: 500.0000 mg | ORAL_TABLET | Freq: Two times a day (BID) | ORAL | 0 refills | Status: AC

## 2022-10-26 MED ORDER — ONDANSETRON HCL 4 MG PO TABS: 4.0000 mg | ORAL_TABLET | Freq: Three times a day (TID) | ORAL | 0 refills | Status: AC | PRN

## 2022-10-26 NOTE — Plan of Care (Signed)
  Problem: Clinical Measurements: Goal: Diagnostic test results will improve Outcome: Not Progressing   Problem: Clinical Measurements: Goal: Will remain free from infection Outcome: Not Progressing

## 2022-10-26 NOTE — Progress Notes (Signed)
[facility] Gastroenterology Progress Note  Wanda Downs 82 y.o. 1940/10/21  CC: GI bleed, abdominal pain, diarrhea   Subjective: Patient seen and examined at bedside.  Feeling significantly better today.  Denies any further bleeding.  Denies any further nausea vomiting or diarrhea.  Wants to go home.  ROS : Afebrile, negative for chest pain   Objective: Vital signs in last 24 hours: Vitals:   10/26/22 0051 10/26/22 0510  BP: (!) 102/40 (!) 115/52  Pulse: 69 68  Resp: 14 14  Temp: 98.8 F (37.1 C) 98.9 F (37.2 C)  SpO2: 96% 95%    Physical Exam:  General:  Alert, cooperative, no distress, appears stated age  Head:  Normocephalic, without obvious abnormality, atraumatic  Eyes:  , EOM's intact,   Lungs:   Clear to auscultation bilaterally, respirations unlabored  Heart:  Regular rate and rhythm, S1, S2 normal  Abdomen:   Soft, non-tender, bowel sounds active all four quadrants,  no masses,   Extremities: Extremities normal, atraumatic, no  edema  Pulses: 2+ and symmetric    Lab Results: Recent Labs    10/25/22 1737 10/26/22 0656  NA 133* 136  K 3.5 3.3*  CL 101 106  CO2 24 25  GLUCOSE 107* 112*  BUN 7* <5*  CREATININE 0.59 0.70  CALCIUM 8.3* 7.7*   Recent Labs    10/24/22 1510 10/26/22 0656  AST 24 17  ALT 14 11  ALKPHOS 75 40  BILITOT 0.7 0.4  PROT 7.6 5.1*  ALBUMIN 4.0 2.6*   Recent Labs    10/24/22 0444 10/24/22 1510 10/25/22 1737 10/26/22 0656  WBC 23.5* 24.7* 17.2* 14.1*  NEUTROABS 22.3* 20.5*  --   --   HGB 14.1 14.7 12.5 11.0*  HCT 43.4 42.9 38.9 33.1*  MCV 91.8 87.0 94.9 90.7  PLT 297 293 196 192   Recent Labs    10/24/22 1510  LABPROT 13.5  INR 1.0      Assessment/Plan: -Rectal bleeding in setting of nausea vomiting and diarrhea.  CT scan showed thickening of the colon from splenic flexure towards the rectum.  Normal hemoglobin but significant leukocytosis.  Overall finding concerning for infectious colitis. 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   Recommendations ------------------------- -Significant improvement in symptoms with antibiotics.  Looks like stool studies have not been collected yet. -Okay to discharge from GI standpoint on oral ciprofloxacin and Flagyl for 10 days. -Advance diet to soft.  Slowly advance diet as tolerated.  Avoid NSAIDs. -She was advised to follow-up with primary GI at [facility] after discharge.  GI will sign off.  Call us back if needed.   Otis Brace MD, [employer] 10/26/2022, 10:30 AM  Contact #  [phone]

## 2022-10-26 NOTE — Discharge Summary (Addendum)
Physician Discharge Summary   Patient: Wanda Downs MRN: [id number] DOB: Nov 01, 1940  Admit date:     10/24/2022  Discharge date: 10/26/22  Discharge Physician: Oswald Hillock   PCP: Velna Hatchet, MD   Recommendations at discharge:   Follow-up GI at [facility] Follow-up PCP in 2 weeks  Discharge Diagnoses: Principal Problem:   Hematochezia Active Problems:   Acute colitis   Hyponatremia   Hypothyroidism   Celiac disease  Resolved Problems:   * No resolved hospital problems. [facility] Hospital Course:  Mrs. Wanda Downs is an 82 y.o. F with hypothyroidism, fibromyalgia and migraines, and Celiac disease who presented with coffee-ground emesis, bright red blood per rectum and abdominal pain.    1/15: Admitted on PPI, Hgb stable, GI consulted  Assessment and Plan:   Hematochezia -CT scan abdomen showed thickening of colon from splenic flexure down to rectum -GI was consulted, concern for infectious colitis -She has history of IBS-D, celiac disease -Stool sample for C. difficile PCR as well as GI pathogen panel not obtained as patient did not have diarrhea in the hospital -GI recommends to discharge home Cipro and Flagyl for total 10 days -Will discharge on Cipro and Flagyl for 8 more days to complete 10 days of treatment -Avoid NSAIDs -Patient recommended to follow-up with primary GI at [facility] after discharge  Hyponatremia -Resolved; today sodium is 136 -Likely in setting of dehydration from diarrhea  Hypokalemia -Potassium 3.3 today -Replace potassium with K-Dur 40 mEq p.o. x 1 before discharge  Celiac disease -GI consulted -Recommend to follow-up with primary GI as outpatient   Hypothyroidism -TSH 4.76 -Continue thyroid Armour                  Consultants: Gastroenterology Procedures performed: None Disposition: Home Diet recommendation:  Discharge Diet Orders (From admission, onward)     Start     Ordered   10/26/22 0000  Diet - low  sodium heart healthy        10/26/22 1243           Regular diet DISCHARGE MEDICATION: Allergies as of 10/26/2022       Reactions   Barley Grass Other (See Comments)   **Celiac disease is active stage**   Gluten Meal    Other Reaction(s): Other (See Comments) **Celiac disease is active stage**   Oat Nausea And Vomiting   **celiac disease is active stage**   Wheat Extract Other (See Comments)   **Celiac disease is active stage**   Amlodipine Besylate Other (See Comments)   "Feels awful"   Olmesartan Medoxomil Other (See Comments)   GI celiac issues   Penicillins Swelling, Rash, Other (See Comments)   "Fever," also   Sulfa Antibiotics Nausea Only, Swelling, Rash        Medication List     STOP taking these medications    meloxicam 7.5 MG tablet Commonly known as: MOBIC   SUMAtriptan 100 MG tablet Commonly known as: IMITREX   Voltaren 1 % Gel Generic drug: diclofenac Sodium       TAKE these medications    azelastine 0.05 % ophthalmic solution Commonly known as: OPTIVAR Place 1 drop into both eyes Once daily as needed (for seasonal allergies).   b complex vitamins capsule Take 1 capsule by mouth daily.   benzonatate 100 MG capsule Commonly known as: TESSALON Take 100 mg by mouth 3 (three) times daily as needed for cough.   ciprofloxacin 500 MG tablet Commonly known as: Cipro Take  1 tablet (500 mg total) by mouth 2 (two) times daily for 8 days.   estradiol 0.1 MG/24HR patch Commonly known as: VIVELLE-DOT Place 1 patch onto the skin 2 (two) times a week.   fluticasone 50 MCG/ACT nasal spray Commonly known as: FLONASE Place 2 sprays into the nose daily. What changed:  when to take this reasons to take this   lisinopril 10 MG tablet Commonly known as: ZESTRIL Take 10 mg by mouth daily.   metroNIDAZOLE 500 MG tablet Commonly known as: Flagyl Take 1 tablet (500 mg total) by mouth 2 (two) times daily for 8 days.   MSM PO Take 2 capsules by  mouth daily.   NexIUM 24HR 20 MG capsule Generic drug: esomeprazole Take 20 mg by mouth at bedtime.   NONFORMULARY OR COMPOUNDED ITEM Take 1 capsule by mouth See admin instructions. T3/T4 compounded combination capsule (9 mcg T-3 and 38 mcg T-4)- Take 1 capsule by mouth in the morning before breakfast every morning   NONFORMULARY OR COMPOUNDED ITEM Take 40 mg by mouth See admin instructions. Compounded 40 mg Progesterone capsule- Take 40 mg by mouth at bedtime   ondansetron 4 MG tablet Commonly known as: Zofran Take 1 tablet (4 mg total) by mouth every 8 (eight) hours as needed for nausea or vomiting.   thyroid 60 MG tablet Commonly known as: ARMOUR Take 60 mg by mouth daily. Take 1 tablet po daily of compounded 75 mg   Tums 500 MG chewable tablet Generic drug: calcium carbonate Chew 1 tablet by mouth 3 (three) times daily as needed for indigestion or heartburn.   Tylenol 325 MG tablet Generic drug: acetaminophen Take 325-650 mg by mouth every 6 (six) hours as needed for mild pain or headache.   VITAMIN A PO Take 1 capsule by mouth daily.   Vitamin D3 1000 units Caps Take 1,000 Units by mouth daily.        Follow-up Information     Velna Hatchet, MD Follow up in 2 week(s).   Specialty: Internal Medicine Contact information: [place] [address] [phone]                Discharge Exam: There were no vitals filed for this visit. General-appears in no acute distress Heart-S1-S2, regular, no murmur auscultated Lungs-clear to auscultation bilaterally, no wheezing or crackles auscultated Abdomen-soft, nontender, no organomegaly Extremities-no edema in the lower extremities Neuro-alert, oriented x3, no focal deficit noted  Condition at discharge: good  The results of significant diagnostics from this hospitalization (including imaging, microbiology, ancillary and laboratory) are listed below for reference.   Imaging Studies: CT Angio  Abd/Pel W and/or Wo Contrast  Result Date: 10/24/2022 CLINICAL DATA:  Vomiting, rectal bleeding for 1 day EXAM: CTA ABDOMEN AND PELVIS WITHOUT AND WITH CONTRAST TECHNIQUE: Multidetector CT imaging of the abdomen and pelvis was performed using the standard protocol during bolus administration of intravenous contrast. Multiplanar reconstructed images and MIPs were obtained and reviewed to evaluate the vascular anatomy. RADIATION DOSE REDUCTION: This exam was performed according to the departmental dose-optimization program which includes automated exposure control, adjustment of the mA and/or kV according to patient size and/or use of iterative reconstruction technique. CONTRAST:  73m OMNIPAQUE IOHEXOL 350 MG/ML SOLN COMPARISON:  None Available. FINDINGS: VASCULAR Aorta: Normal caliber aorta without aneurysm, dissection, vasculitis or significant stenosis. Diffuse atherosclerosis. Celiac: Patent without evidence of aneurysm, dissection, vasculitis or significant stenosis. SMA: Patent without evidence of aneurysm, dissection, vasculitis or significant stenosis. Renals: There are  2 right renal arteries and a single left renal artery. The bilateral renal arteries are patent without evidence of aneurysm, dissection, vasculitis, or fibromuscular dysplasia. IMA: Patent without evidence of aneurysm, dissection, vasculitis or significant stenosis. Inflow: Patent without evidence of aneurysm, dissection, vasculitis or significant stenosis. Proximal Outflow: Bilateral common femoral and visualized portions of the superficial and profunda femoral arteries are patent without evidence of aneurysm, dissection, vasculitis or significant stenosis. Mild atherosclerosis. Veins: No obvious venous abnormality within the limitations of this arterial phase study. Review of the MIP images confirms the above findings. NON-VASCULAR Lower chest: No acute pleural or parenchymal lung disease. Hepatobiliary: No focal liver abnormality is seen.  No gallstones, gallbladder wall thickening, or biliary dilatation. Pancreas: Unremarkable. No pancreatic ductal dilatation or surrounding inflammatory changes. Spleen: Normal in size without focal abnormality. Adrenals/Urinary Tract: Adrenal glands are unremarkable. Kidneys are normal, without renal calculi, focal lesion, or hydronephrosis. Bladder is unremarkable. Stomach/Bowel: There is severe colonic wall thickening extending from the splenic flexure through the rectum, most compatible with inflammatory or infectious colitis. No bowel obstruction or ileus. Normal appendix right lower quadrant. There is no accumulation of contrast within the bowel lumen to suggest active gastrointestinal bleeding. Lymphatic: No pathologic adenopathy within the abdomen or pelvis. Reproductive: Status post hysterectomy. No adnexal masses. Other: Small amount of free fluid within the lower abdomen and pelvis. There is a right femoral hernia containing a small amount of ascites as well. No bowel herniation. No free intraperitoneal gas. Musculoskeletal: No acute or destructive bony lesions. Reconstructed images demonstrate no additional findings. IMPRESSION: VASCULAR 1. No evidence of active gastrointestinal bleeding. 2.  Aortic Atherosclerosis (ICD10-I70.0). NON-VASCULAR 1. Marked colonic wall thickening from the splenic flexure through the rectum, consistent with inflammatory or infectious colitis. 2. Small volume ascites. 3. Small fluid containing right femoral hernia. No bowel herniation. Electronically Signed   By: Randa Ngo M.D.   On: 10/24/2022 16:20    Microbiology: No results found for this or any previous visit.  Labs: CBC: Recent Labs  Lab 10/24/22 0444 10/24/22 1510 10/25/22 1737 10/26/22 0656  WBC 23.5* 24.7* 17.2* 14.1*  NEUTROABS 22.3* 20.5*  --   --   HGB 14.1 14.7 12.5 11.0*  HCT 43.4 42.9 38.9 33.1*  MCV 91.8 87.0 94.9 90.7  PLT 297 293 196 283   Basic Metabolic Panel: Recent Labs  Lab  10/24/22 0444 10/24/22 1510 10/25/22 1737 10/26/22 0656  NA 122* 124* 133* 136  K 3.7 3.8 3.5 3.3*  CL 87* 85* 101 106  CO2 21* '27 24 25  '$ GLUCOSE 156* 154* 107* 112*  BUN 10 9 7* <5*  CREATININE 0.88 0.70 0.59 0.70  CALCIUM 8.9 9.0 8.3* 7.7*   Liver Function Tests: Recent Labs  Lab 10/24/22 0444 10/24/22 1510 10/26/22 0656  AST '27 24 17  '$ ALT '15 14 11  '$ ALKPHOS 77 75 40  BILITOT 0.8 0.7 0.4  PROT 7.1 7.6 5.1*  ALBUMIN 3.9 4.0 2.6*   CBG: No results for input(s): "GLUCAP" in the last 168 hours.  Discharge time spent: greater than 30 minutes.  Signed: Oswald Hillock, MD [facility] 10/26/2022

## 2022-10-26 NOTE — Progress Notes (Signed)
Mobility Specialist - Progress Note   10/26/22 1001  Mobility  Activity Ambulated with assistance in hallway  Level of Assistance Independent after set-up  Assistive Device None  Distance Ambulated (ft) 500 ft  Activity Response Tolerated well  Mobility Referral Yes  $Mobility charge 1 Mobility   Pt received in bed and agreed to mobility, had no c/o pain nor discomfort during session. Pt eager to get home. Pt returned to bed with all needs met.  Roderick Pee Mobility Specialist

## 2022-11-11 DIAGNOSIS — N951 Menopausal and female climacteric states: Secondary | ICD-10-CM | POA: Diagnosis not present

## 2022-11-12 DIAGNOSIS — N951 Menopausal and female climacteric states: Secondary | ICD-10-CM | POA: Diagnosis not present

## 2022-12-14 ENCOUNTER — Ambulatory Visit (INDEPENDENT_AMBULATORY_CARE_PROVIDER_SITE_OTHER): Payer: Medicare Other | Admitting: Orthopaedic Surgery

## 2022-12-14 VITALS — BP 134/65 | HR 70 | Ht 61.0 in | Wt 115.0 lb

## 2022-12-14 DIAGNOSIS — M7542 Impingement syndrome of left shoulder: Secondary | ICD-10-CM | POA: Diagnosis not present

## 2022-12-15 DIAGNOSIS — M7542 Impingement syndrome of left shoulder: Secondary | ICD-10-CM | POA: Diagnosis not present

## 2022-12-15 MED ORDER — BUPIVACAINE HCL 0.25 % IJ SOLN
4.0000 mL | INTRAMUSCULAR | Status: AC | PRN
  Administered 2022-12-15: 4 mL via INTRA_ARTICULAR

## 2022-12-15 MED ORDER — METHYLPREDNISOLONE ACETATE 40 MG/ML IJ SUSP
40.0000 mg | INTRAMUSCULAR | Status: AC | PRN
  Administered 2022-12-15: 40 mg via INTRA_ARTICULAR

## 2022-12-15 MED ORDER — LIDOCAINE HCL 1 % IJ SOLN
0.5000 mL | INTRAMUSCULAR | Status: AC | PRN
  Administered 2022-12-15: .5 mL

## 2022-12-15 NOTE — Progress Notes (Signed)
Office Visit Note   Patient: Wanda Downs           Date of Birth: [birth year]           MRN: [id number] Visit Date: 12/14/2022              Requested by: Velna Hatchet, MD [address] [place],  [place] [address] PCP: Velna Hatchet, MD   Assessment & Plan: Visit Diagnoses:  1. Impingement syndrome of left shoulder     Plan: Injection performed with good improvement.  She will follow-up she has ongoing symptoms will let us know we can consider diagnostic imaging to rule out partial rotator cuff tearing.  Follow-Up Instructions: No follow-ups on file.   Orders:  Orders Placed This Encounter  Procedures   Large Joint Inj   No orders of the defined types were placed in this encounter.     Procedures: Large Joint Inj: L subacromial bursa on 12/15/2022 8:57 AM Indications: pain Details: 22 G 1.5 in needle, lateral approach  Arthrogram: No  Medications: 4 mL bupivacaine 0.25 %; 40 mg methylPREDNISolone acetate 40 MG/ML; 0.5 mL lidocaine 1 % Outcome: tolerated well, no immediate complications Procedure, treatment alternatives, risks and benefits explained, specific risks discussed. Consent was given by the patient. Immediately prior to procedure a time out was called to verify the correct patient, procedure, equipment, support staff and site/side marked as required. Patient was prepped and draped in the usual sterile fashion.       Clinical Data: No additional findings.   Subjective: Chief Complaint  Patient presents with   Left Shoulder - Pain    HPI 82 year old female with history of spondyloarthropathy celiac disease depression returns after previous injection in November 2023 gave her great relief for many months in her left shoulder.  Gradually increase symptoms difficulty outstretched overhead activities and requesting a repeat injection.  She had 1 hydrocodone left from tear duct surgery took half of it and got some relief.  She has tried Tylenol ice.  No  numbness or tingling in her fingers no gait disturbance.  Review of Systems all systems updated unchanged from 08/23/2022 office visit.   Objective: Vital Signs: BP 134/65   Pulse 70   Ht '5\' 1"'$  (1.549 m)   Wt 115 lb (52.2 kg)   BMI 21.73 kg/m   Physical Exam Constitutional:      Appearance: She is well-developed.  HENT:     Head: Normocephalic.     Right Ear: External ear normal.     Left Ear: External ear normal. There is no impacted cerumen.  Eyes:     Pupils: Pupils are equal, round, and reactive to light.  Neck:     Thyroid: No thyromegaly.     Trachea: No tracheal deviation.  Cardiovascular:     Rate and Rhythm: Normal rate.  Pulmonary:     Effort: Pulmonary effort is normal.  Abdominal:     Palpations: Abdomen is soft.  Musculoskeletal:     Cervical back: No rigidity.  Skin:    General: Skin is warm and dry.  Neurological:     Mental Status: She is alert and oriented to person, place, and time.  Psychiatric:        Behavior: Behavior normal.     Ortho Exam positive impingement left shoulder.  Upper extremity reflexes are normal no brachial plexus tenderness good cervical flexion and extension.  Ulnar nerve at the elbow median nerve at the wrist is normal.  Specialty  Comments:  No specialty comments available.  Imaging: No results found.   PMFS History: Patient Active Problem List   Diagnosis Date Noted   Impingement syndrome of left shoulder 12/15/2022   Hematochezia 10/24/2022   Acute colitis 10/24/2022   Hyponatremia 10/24/2022   HLA-B27 spondyloarthropathy 07/16/2012   BACK PAIN 05/24/2010   Hypothyroidism 09/12/2007   DEPRESSION 09/12/2007   COMMON MIGRAINE 09/12/2007   Celiac disease 09/12/2007   OSTEOARTHRITIS 09/12/2007   OSTEOPENIA 09/12/2007   LIVER FUNCTION TESTS, ABNORMAL 09/12/2007   Past Medical History:  Diagnosis Date   Celiac disease    Depression    Elevated LFTs    check Hep B & C   FHx: migraine headaches     Fibromyalgia    Hypothyroidism    Osteoarthritis    Osteopenia     Family History  Problem Relation Age of Onset   Hypertension Mother    Arthritis Mother     Past Surgical History:  Procedure Laterality Date   ABDOMINAL HYSTERECTOMY     Bilateral S & O   AUGMENTATION MAMMAPLASTY Bilateral    BLADDER SURGERY     bone spur     left knee   bone spur     hand   BREAST SURGERY     Breast implants X 2   COSMETIC SURGERY     Plastic surgery (face)   EXCISION [facility]     KNEE ARTHROSCOPY     right knee   NASAL SINUS SURGERY     right hand fx     Social History   Occupational History   Not on file  Tobacco Use   Smoking status: Former   Smokeless tobacco: Never  Substance and Sexual Activity   Alcohol use: No   Drug use: No   Sexual activity: Not on file

## 2023-01-19 DIAGNOSIS — K219 Gastro-esophageal reflux disease without esophagitis: Secondary | ICD-10-CM | POA: Diagnosis not present

## 2023-01-19 DIAGNOSIS — K900 Celiac disease: Secondary | ICD-10-CM | POA: Diagnosis not present

## 2023-01-19 DIAGNOSIS — K2270 Barrett's esophagus without dysplasia: Secondary | ICD-10-CM | POA: Diagnosis not present

## 2023-02-14 DIAGNOSIS — L218 Other seborrheic dermatitis: Secondary | ICD-10-CM | POA: Diagnosis not present

## 2023-02-14 DIAGNOSIS — L570 Actinic keratosis: Secondary | ICD-10-CM | POA: Diagnosis not present

## 2023-02-14 DIAGNOSIS — Z85828 Personal history of other malignant neoplasm of skin: Secondary | ICD-10-CM | POA: Diagnosis not present

## 2023-02-20 DIAGNOSIS — I10 Essential (primary) hypertension: Secondary | ICD-10-CM | POA: Diagnosis not present

## 2023-02-20 DIAGNOSIS — F330 Major depressive disorder, recurrent, mild: Secondary | ICD-10-CM | POA: Diagnosis not present

## 2023-02-20 DIAGNOSIS — E039 Hypothyroidism, unspecified: Secondary | ICD-10-CM | POA: Diagnosis not present

## 2023-02-20 DIAGNOSIS — K219 Gastro-esophageal reflux disease without esophagitis: Secondary | ICD-10-CM | POA: Diagnosis not present

## 2023-02-20 DIAGNOSIS — E785 Hyperlipidemia, unspecified: Secondary | ICD-10-CM | POA: Diagnosis not present

## 2023-02-20 DIAGNOSIS — R197 Diarrhea, unspecified: Secondary | ICD-10-CM | POA: Diagnosis not present

## 2023-02-20 DIAGNOSIS — K900 Celiac disease: Secondary | ICD-10-CM | POA: Diagnosis not present

## 2023-02-20 DIAGNOSIS — F419 Anxiety disorder, unspecified: Secondary | ICD-10-CM | POA: Diagnosis not present

## 2023-02-20 DIAGNOSIS — M810 Age-related osteoporosis without current pathological fracture: Secondary | ICD-10-CM | POA: Diagnosis not present

## 2023-02-20 DIAGNOSIS — M797 Fibromyalgia: Secondary | ICD-10-CM | POA: Diagnosis not present

## 2023-02-21 DIAGNOSIS — N951 Menopausal and female climacteric states: Secondary | ICD-10-CM | POA: Diagnosis not present

## 2023-04-27 DIAGNOSIS — N951 Menopausal and female climacteric states: Secondary | ICD-10-CM | POA: Diagnosis not present

## 2023-05-12 DIAGNOSIS — N951 Menopausal and female climacteric states: Secondary | ICD-10-CM | POA: Diagnosis not present

## 2023-06-22 DIAGNOSIS — K2270 Barrett's esophagus without dysplasia: Secondary | ICD-10-CM | POA: Diagnosis not present

## 2023-06-22 DIAGNOSIS — K219 Gastro-esophageal reflux disease without esophagitis: Secondary | ICD-10-CM | POA: Diagnosis not present

## 2023-06-22 DIAGNOSIS — K580 Irritable bowel syndrome with diarrhea: Secondary | ICD-10-CM | POA: Diagnosis not present

## 2023-06-22 DIAGNOSIS — K900 Celiac disease: Secondary | ICD-10-CM | POA: Diagnosis not present

## 2023-07-22 DIAGNOSIS — Z23 Encounter for immunization: Secondary | ICD-10-CM | POA: Diagnosis not present

## 2023-08-15 DIAGNOSIS — M810 Age-related osteoporosis without current pathological fracture: Secondary | ICD-10-CM | POA: Diagnosis not present

## 2023-08-15 DIAGNOSIS — E785 Hyperlipidemia, unspecified: Secondary | ICD-10-CM | POA: Diagnosis not present

## 2023-08-15 DIAGNOSIS — I10 Essential (primary) hypertension: Secondary | ICD-10-CM | POA: Diagnosis not present

## 2023-08-15 DIAGNOSIS — E039 Hypothyroidism, unspecified: Secondary | ICD-10-CM | POA: Diagnosis not present

## 2023-08-17 DIAGNOSIS — L821 Other seborrheic keratosis: Secondary | ICD-10-CM | POA: Diagnosis not present

## 2023-08-17 DIAGNOSIS — Z85828 Personal history of other malignant neoplasm of skin: Secondary | ICD-10-CM | POA: Diagnosis not present

## 2023-08-17 DIAGNOSIS — L570 Actinic keratosis: Secondary | ICD-10-CM | POA: Diagnosis not present

## 2023-08-17 DIAGNOSIS — L853 Xerosis cutis: Secondary | ICD-10-CM | POA: Diagnosis not present

## 2023-08-17 DIAGNOSIS — L858 Other specified epidermal thickening: Secondary | ICD-10-CM | POA: Diagnosis not present

## 2023-08-21 DIAGNOSIS — I10 Essential (primary) hypertension: Secondary | ICD-10-CM | POA: Diagnosis not present

## 2023-08-21 DIAGNOSIS — K900 Celiac disease: Secondary | ICD-10-CM | POA: Diagnosis not present

## 2023-08-21 DIAGNOSIS — Z0000 Encounter for general adult medical examination without abnormal findings: Secondary | ICD-10-CM | POA: Diagnosis not present

## 2023-08-21 DIAGNOSIS — Z23 Encounter for immunization: Secondary | ICD-10-CM | POA: Diagnosis not present

## 2023-08-21 DIAGNOSIS — M797 Fibromyalgia: Secondary | ICD-10-CM | POA: Diagnosis not present

## 2023-08-21 DIAGNOSIS — R197 Diarrhea, unspecified: Secondary | ICD-10-CM | POA: Diagnosis not present

## 2023-08-21 DIAGNOSIS — E785 Hyperlipidemia, unspecified: Secondary | ICD-10-CM | POA: Diagnosis not present

## 2023-08-21 DIAGNOSIS — E039 Hypothyroidism, unspecified: Secondary | ICD-10-CM | POA: Diagnosis not present

## 2023-08-21 DIAGNOSIS — Z1331 Encounter for screening for depression: Secondary | ICD-10-CM | POA: Diagnosis not present

## 2023-08-21 DIAGNOSIS — R1084 Generalized abdominal pain: Secondary | ICD-10-CM | POA: Diagnosis not present

## 2023-08-21 DIAGNOSIS — F330 Major depressive disorder, recurrent, mild: Secondary | ICD-10-CM | POA: Diagnosis not present

## 2023-08-21 DIAGNOSIS — K219 Gastro-esophageal reflux disease without esophagitis: Secondary | ICD-10-CM | POA: Diagnosis not present

## 2023-08-21 DIAGNOSIS — M810 Age-related osteoporosis without current pathological fracture: Secondary | ICD-10-CM | POA: Diagnosis not present

## 2023-08-21 DIAGNOSIS — F419 Anxiety disorder, unspecified: Secondary | ICD-10-CM | POA: Diagnosis not present

## 2023-08-21 DIAGNOSIS — Z1339 Encounter for screening examination for other mental health and behavioral disorders: Secondary | ICD-10-CM | POA: Diagnosis not present

## 2023-08-31 DIAGNOSIS — N951 Menopausal and female climacteric states: Secondary | ICD-10-CM | POA: Diagnosis not present

## 2023-09-14 DIAGNOSIS — N951 Menopausal and female climacteric states: Secondary | ICD-10-CM | POA: Diagnosis not present

## 2023-09-14 DIAGNOSIS — E559 Vitamin D deficiency, unspecified: Secondary | ICD-10-CM | POA: Diagnosis not present

## 2023-09-14 DIAGNOSIS — R7989 Other specified abnormal findings of blood chemistry: Secondary | ICD-10-CM | POA: Diagnosis not present

## 2023-09-14 DIAGNOSIS — E039 Hypothyroidism, unspecified: Secondary | ICD-10-CM | POA: Diagnosis not present

## 2023-09-14 DIAGNOSIS — E782 Mixed hyperlipidemia: Secondary | ICD-10-CM | POA: Diagnosis not present

## 2023-09-20 DIAGNOSIS — N951 Menopausal and female climacteric states: Secondary | ICD-10-CM | POA: Diagnosis not present

## 2023-10-13 DIAGNOSIS — H5213 Myopia, bilateral: Secondary | ICD-10-CM | POA: Diagnosis not present

## 2023-10-13 DIAGNOSIS — H43813 Vitreous degeneration, bilateral: Secondary | ICD-10-CM | POA: Diagnosis not present

## 2023-10-13 DIAGNOSIS — H04123 Dry eye syndrome of bilateral lacrimal glands: Secondary | ICD-10-CM | POA: Diagnosis not present

## 2023-10-13 DIAGNOSIS — H00014 Hordeolum externum left upper eyelid: Secondary | ICD-10-CM | POA: Diagnosis not present

## 2023-10-13 DIAGNOSIS — H524 Presbyopia: Secondary | ICD-10-CM | POA: Diagnosis not present

## 2023-10-25 DIAGNOSIS — N951 Menopausal and female climacteric states: Secondary | ICD-10-CM | POA: Diagnosis not present

## 2023-11-13 DIAGNOSIS — M797 Fibromyalgia: Secondary | ICD-10-CM | POA: Diagnosis not present

## 2023-11-13 DIAGNOSIS — S00512A Abrasion of oral cavity, initial encounter: Secondary | ICD-10-CM | POA: Diagnosis not present

## 2023-11-13 DIAGNOSIS — K900 Celiac disease: Secondary | ICD-10-CM | POA: Diagnosis not present

## 2023-11-13 DIAGNOSIS — R051 Acute cough: Secondary | ICD-10-CM | POA: Diagnosis not present

## 2023-11-13 DIAGNOSIS — I10 Essential (primary) hypertension: Secondary | ICD-10-CM | POA: Diagnosis not present

## 2023-11-13 DIAGNOSIS — K219 Gastro-esophageal reflux disease without esophagitis: Secondary | ICD-10-CM | POA: Diagnosis not present

## 2023-11-15 NOTE — Telephone Encounter (Signed)
 Patient calling to report seeing PCP on yesterday and was advised to follow up with Dr Landy.  Reports difficulty eating due to cough every time food is put in her mouth.   States I can't eat or sleep because of this cough. Reports pain in the hard palate that causes the coughing when food is put in mouth.  Patient was told to continue esomeprazole , start Pepcid and Magic Mouth Wash.  Reports Magic Mouthwash is helping and she has been able to eat without coughing for the first time in months.  Patient already scheduled for f/u  on 01/11/24.  Dr Landy- would you like to see her sooner?  Callback # : [phone]

## 2023-11-20 NOTE — Telephone Encounter (Signed)
 Honestly, this does not sound like it is directly related to her GI tract.  If she coughs just when food is in her mouth, that sounds like more of an issue with her oral cavity.  It sounds as though her PCP felt that there was some acute change to her baseline chronic cough which we think is due to lisinopril.  She has been unwilling to change from lisinopril.  If it is truly an acute change, then I would think of something more infectious in nature and not GI related.  I think we can just keep her scheduled since the Magic mouthwash they gave her seems to have helped.

## 2023-11-28 DIAGNOSIS — N951 Menopausal and female climacteric states: Secondary | ICD-10-CM | POA: Diagnosis not present

## 2023-12-21 DIAGNOSIS — H9190 Unspecified hearing loss, unspecified ear: Secondary | ICD-10-CM | POA: Diagnosis not present

## 2023-12-21 DIAGNOSIS — R053 Chronic cough: Secondary | ICD-10-CM | POA: Diagnosis not present

## 2023-12-21 NOTE — Progress Notes (Signed)
 Subjective  Patient ID: Wanda Downs is a 83 y.o. female being seen for:  Chief Complaint  Patient presents with  . Cough  . Hearing Loss     HPI   Patient with a chronic cough for approximately the past year.  She reports that this started after she had C. difficile infection and was hospitalized due to this.  She does have a history of reflux but is been taking Nexium  for some time without improvement.  She also reports that she started lisinopril approximately 1 year ago.  She also was previously regularly taking amitriptyline which she reports was prescribed for her cough but did not help.  She has been infrequently taking that recently.  She also reports that her hearing has been slowly decreasing for many years and that family members are concerned about hearing loss.  Review of Systems: all relevant systems have been reviewed unless otherwise documented.  Past Medical History:  Diagnosis Date  . Celiac disease 07/03/2014  . Colon polyp   . Epigastric pain 07/03/2014  . Fibromyalgia 10/20/2016  . GERD (gastroesophageal reflux disease)   . Irritable bowel syndrome with diarrhea 10/09/2015  . Migraine   . Osteoporosis   . Stomach ulcer   . Thyroid  disease   . Urinary urgency 02/22/2017    Past Surgical History:  Procedure Laterality Date  . BLADDER SURGERY     Procedure: BLADDER SURGERY  . BREAST AUGMENTATION     Procedure: BREAST IMPLANT PLACEMENT  . ESOPHAGOGASTRODUODENOSCOPY N/A 02/05/2020   Procedure: EGD;  Surgeon: Belvie Toribio Seip, MD;  Location: [facility] ENDO OR;  Service: Gastroenterology;  Laterality: N/A;  . HAND SURGERY     Procedure: HAND SURGERY  . HYSTERECTOMY      Procedure: HYSTERECTOMY  . KNEE SURGERY     Procedure: KNEE SURGERY  . OOPHORECTOMY     Procedure: OOPHORECTOMY  . SINUS SURGERY     Procedure: SINUS SURGERY    Family History  Problem Relation Name Age of Onset  . Osteoarthritis Mother    . Rheum arthritis Neg Hx    . Lupus Neg Hx     . Thyroid  disease Neg Hx      Allergies  Allergen Reactions  . Barley Other (See Comments)    **celiac disease is active stage**  . Gluten Other (See Comments)    **Celiac disease is active stage**  . Oats ([place]vena) Other (See Comments)    **celiac disease is active stage**  . Penicillins Anaphylaxis, Other (See Comments), Rash and Swelling    Other Reaction(s): Swells, fever  Other Reaction: Fever  Fever, also  All over swelling  . Rye Grass Other (See Comments)    **celiac disease is active stage**  . Wheat Containing Prod Other (See Comments)    **celiac disease is active stage**  . Olmesartan Medoxomil Other (See Comments)    Other Reaction(s): GI celiac issues  GI celiac issues  . Penicillin G Swelling and Other (See Comments)    Reaction: Fever (intolerance); All over swelling   . Sulfamethoxazole GI Intolerance     Objective  Physical Exam: General/Constitutional: Patient is a well-nourished, well-developed in no distress. Answers questions appropriately.  Skin/scalp : Normal and without lesions. No rashes, ulcerations or masses noted.  Head: No facial deformities  Eyes: Vision grossly intact. Normal extraocular movements. No nystagmus noted.  Ears: Right ear: Normal ear canal. Normal tympanic membrane. Left ear: Normal ear canal. Normal tympanic membrane.  Nose: Septum midline, turbinates slightly enlarged  Oral cavity and oropharynx: No concerning lesions in the oral cavity or pharynx.  Neck: No palpable masses or lesions    Assessment/Plan  1. Chronic cough (Primary) We discussed potential contributors to her chronic cough and available treatment options.  Notably she is currently on lisinopril which she reports that she started roughly 1 year ago around the time of the cough.  I recommend that she discussed switching to an ARB if possible with her PCP.  Additionally the amitriptyline which has good evidence for chronic neuropathic cough does not  seem to help and she may want to consider weaning off of this if that was the primary indication for starting.  We discussed a trial of Tessalon Perles which she does not remember trying however I would recommend that she discuss switching off the lisinopril first.  Likely reflux is not a significant contributor to this in her history.  2. Hearing loss, unspecified hearing loss type, unspecified laterality We will set up for audiometric testing at the follow-up visit.   No orders of the defined types were placed in this encounter.   No follow-ups on file.   Electronically signed by: Arthea Fries, MD 12/21/2023 2:21 PM

## 2024-01-11 DIAGNOSIS — K900 Celiac disease: Secondary | ICD-10-CM | POA: Diagnosis not present

## 2024-02-09 NOTE — Telephone Encounter (Signed)
 Refill sent.

## 2024-02-13 DIAGNOSIS — E039 Hypothyroidism, unspecified: Secondary | ICD-10-CM | POA: Diagnosis not present

## 2024-02-27 DIAGNOSIS — H903 Sensorineural hearing loss, bilateral: Secondary | ICD-10-CM | POA: Diagnosis not present

## 2024-03-10 DIAGNOSIS — E039 Hypothyroidism, unspecified: Secondary | ICD-10-CM | POA: Diagnosis not present

## 2024-03-12 DIAGNOSIS — E039 Hypothyroidism, unspecified: Secondary | ICD-10-CM | POA: Diagnosis not present

## 2024-03-12 DIAGNOSIS — N951 Menopausal and female climacteric states: Secondary | ICD-10-CM | POA: Diagnosis not present

## 2024-03-12 DIAGNOSIS — E559 Vitamin D deficiency, unspecified: Secondary | ICD-10-CM | POA: Diagnosis not present

## 2024-03-12 DIAGNOSIS — E782 Mixed hyperlipidemia: Secondary | ICD-10-CM | POA: Diagnosis not present

## 2024-03-12 DIAGNOSIS — R7989 Other specified abnormal findings of blood chemistry: Secondary | ICD-10-CM | POA: Diagnosis not present

## 2024-03-14 DIAGNOSIS — D485 Neoplasm of uncertain behavior of skin: Secondary | ICD-10-CM | POA: Diagnosis not present

## 2024-03-14 DIAGNOSIS — Z85828 Personal history of other malignant neoplasm of skin: Secondary | ICD-10-CM | POA: Diagnosis not present

## 2024-03-14 DIAGNOSIS — L570 Actinic keratosis: Secondary | ICD-10-CM | POA: Diagnosis not present

## 2024-03-14 DIAGNOSIS — L821 Other seborrheic keratosis: Secondary | ICD-10-CM | POA: Diagnosis not present

## 2024-03-14 DIAGNOSIS — D0471 Carcinoma in situ of skin of right lower limb, including hip: Secondary | ICD-10-CM | POA: Diagnosis not present

## 2024-03-14 DIAGNOSIS — C44729 Squamous cell carcinoma of skin of left lower limb, including hip: Secondary | ICD-10-CM | POA: Diagnosis not present

## 2024-03-14 DIAGNOSIS — L812 Freckles: Secondary | ICD-10-CM | POA: Diagnosis not present

## 2024-03-18 DIAGNOSIS — E039 Hypothyroidism, unspecified: Secondary | ICD-10-CM | POA: Diagnosis not present

## 2024-03-30 DIAGNOSIS — N951 Menopausal and female climacteric states: Secondary | ICD-10-CM | POA: Diagnosis not present

## 2024-05-24 DIAGNOSIS — N951 Menopausal and female climacteric states: Secondary | ICD-10-CM | POA: Diagnosis not present

## 2024-07-15 DIAGNOSIS — D0439 Carcinoma in situ of skin of other parts of face: Secondary | ICD-10-CM | POA: Diagnosis not present

## 2024-07-15 DIAGNOSIS — Z85828 Personal history of other malignant neoplasm of skin: Secondary | ICD-10-CM | POA: Diagnosis not present

## 2024-07-15 DIAGNOSIS — D485 Neoplasm of uncertain behavior of skin: Secondary | ICD-10-CM | POA: Diagnosis not present

## 2024-07-23 DIAGNOSIS — N951 Menopausal and female climacteric states: Secondary | ICD-10-CM | POA: Diagnosis not present

## 2024-07-25 DIAGNOSIS — K2270 Barrett's esophagus without dysplasia: Secondary | ICD-10-CM | POA: Diagnosis not present

## 2024-07-25 DIAGNOSIS — K580 Irritable bowel syndrome with diarrhea: Secondary | ICD-10-CM | POA: Diagnosis not present

## 2024-07-25 DIAGNOSIS — K219 Gastro-esophageal reflux disease without esophagitis: Secondary | ICD-10-CM | POA: Diagnosis not present

## 2024-08-04 ENCOUNTER — Other Ambulatory Visit: Payer: Self-pay

## 2024-08-04 ENCOUNTER — Inpatient Hospital Stay (HOSPITAL_BASED_OUTPATIENT_CLINIC_OR_DEPARTMENT_OTHER)
Admission: EM | Admit: 2024-08-04 | Discharge: 2024-08-07 | DRG: 917 | Disposition: A | Discharge status: Home / Self Care | Attending: Internal Medicine | Admitting: Internal Medicine

## 2024-08-04 ENCOUNTER — Encounter (HOSPITAL_BASED_OUTPATIENT_CLINIC_OR_DEPARTMENT_OTHER): Payer: Self-pay

## 2024-08-04 ENCOUNTER — Emergency Department (HOSPITAL_BASED_OUTPATIENT_CLINIC_OR_DEPARTMENT_OTHER)

## 2024-08-04 DIAGNOSIS — R54 Age-related physical debility: Secondary | ICD-10-CM | POA: Diagnosis present

## 2024-08-04 DIAGNOSIS — Z880 Allergy status to penicillin: Secondary | ICD-10-CM

## 2024-08-04 DIAGNOSIS — Z681 Body mass index (BMI) 19 or less, adult: Secondary | ICD-10-CM

## 2024-08-04 DIAGNOSIS — Z79818 Long term (current) use of other agents affecting estrogen receptors and estrogen levels: Secondary | ICD-10-CM | POA: Diagnosis not present

## 2024-08-04 DIAGNOSIS — E871 Hypo-osmolality and hyponatremia: Secondary | ICD-10-CM | POA: Diagnosis not present

## 2024-08-04 DIAGNOSIS — M797 Fibromyalgia: Secondary | ICD-10-CM | POA: Diagnosis present

## 2024-08-04 DIAGNOSIS — R748 Abnormal levels of other serum enzymes: Secondary | ICD-10-CM

## 2024-08-04 DIAGNOSIS — E039 Hypothyroidism, unspecified: Secondary | ICD-10-CM | POA: Diagnosis present

## 2024-08-04 DIAGNOSIS — Z9882 Breast implant status: Secondary | ICD-10-CM | POA: Diagnosis not present

## 2024-08-04 DIAGNOSIS — E878 Other disorders of electrolyte and fluid balance, not elsewhere classified: Secondary | ICD-10-CM | POA: Diagnosis present

## 2024-08-04 DIAGNOSIS — Z91018 Allergy to other foods: Secondary | ICD-10-CM | POA: Diagnosis not present

## 2024-08-04 DIAGNOSIS — R7401 Elevation of levels of liver transaminase levels: Secondary | ICD-10-CM | POA: Diagnosis not present

## 2024-08-04 DIAGNOSIS — Z882 Allergy status to sulfonamides status: Secondary | ICD-10-CM | POA: Diagnosis not present

## 2024-08-04 DIAGNOSIS — E860 Dehydration: Secondary | ICD-10-CM | POA: Diagnosis present

## 2024-08-04 DIAGNOSIS — Z23 Encounter for immunization: Secondary | ICD-10-CM | POA: Diagnosis not present

## 2024-08-04 DIAGNOSIS — T391X1A Poisoning by 4-Aminophenol derivatives, accidental (unintentional), initial encounter: Secondary | ICD-10-CM | POA: Diagnosis present

## 2024-08-04 DIAGNOSIS — R109 Unspecified abdominal pain: Secondary | ICD-10-CM | POA: Diagnosis not present

## 2024-08-04 DIAGNOSIS — K7200 Acute and subacute hepatic failure without coma: Principal | ICD-10-CM | POA: Diagnosis present

## 2024-08-04 DIAGNOSIS — Z79899 Other long term (current) drug therapy: Secondary | ICD-10-CM

## 2024-08-04 DIAGNOSIS — Z8261 Family history of arthritis: Secondary | ICD-10-CM

## 2024-08-04 DIAGNOSIS — Z888 Allergy status to other drugs, medicaments and biological substances status: Secondary | ICD-10-CM | POA: Diagnosis not present

## 2024-08-04 DIAGNOSIS — K529 Noninfective gastroenteritis and colitis, unspecified: Secondary | ICD-10-CM | POA: Diagnosis not present

## 2024-08-04 DIAGNOSIS — K900 Celiac disease: Secondary | ICD-10-CM | POA: Diagnosis present

## 2024-08-04 DIAGNOSIS — R627 Adult failure to thrive: Secondary | ICD-10-CM | POA: Diagnosis present

## 2024-08-04 DIAGNOSIS — K5730 Diverticulosis of large intestine without perforation or abscess without bleeding: Secondary | ICD-10-CM | POA: Diagnosis not present

## 2024-08-04 DIAGNOSIS — R945 Abnormal results of liver function studies: Secondary | ICD-10-CM | POA: Diagnosis present

## 2024-08-04 DIAGNOSIS — T391X5A Adverse effect of 4-Aminophenol derivatives, initial encounter: Secondary | ICD-10-CM | POA: Diagnosis present

## 2024-08-04 DIAGNOSIS — Z87891 Personal history of nicotine dependence: Secondary | ICD-10-CM | POA: Diagnosis not present

## 2024-08-04 DIAGNOSIS — Z8249 Family history of ischemic heart disease and other diseases of the circulatory system: Secondary | ICD-10-CM | POA: Diagnosis not present

## 2024-08-04 DIAGNOSIS — R4181 Age-related cognitive decline: Secondary | ICD-10-CM

## 2024-08-04 LAB — LIPASE, BLOOD: Lipase: 10 U/L — ABNORMAL LOW (ref 11–51)

## 2024-08-04 LAB — COMPREHENSIVE METABOLIC PANEL WITH GFR
ALT: 2242 U/L — ABNORMAL HIGH (ref 0–44)
AST: 1916 U/L — ABNORMAL HIGH (ref 15–41)
Albumin: 4.5 g/dL (ref 3.5–5.0)
Alkaline Phosphatase: 85 U/L (ref 38–126)
Anion gap: 12 (ref 5–15)
BUN: 8 mg/dL (ref 8–23)
CO2: 28 mmol/L (ref 22–32)
Calcium: 9.6 mg/dL (ref 8.9–10.3)
Chloride: 84 mmol/L — ABNORMAL LOW (ref 98–111)
Creatinine, Ser: 0.66 mg/dL (ref 0.44–1.00)
GFR, Estimated: >60 mL/min (ref >60)
Glucose, Bld: 119 mg/dL — ABNORMAL HIGH (ref 70–99)
Potassium: 3.8 mmol/L (ref 3.5–5.1)
Sodium: 124 mmol/L — ABNORMAL LOW (ref 135–145)
Total Bilirubin: 0.6 mg/dL (ref 0.0–1.2)
Total Protein: 7.4 g/dL (ref 6.5–8.1)

## 2024-08-04 LAB — CBC
HCT: 41.9 % (ref 36.0–46.0)
Hemoglobin: 14.6 g/dL (ref 12.0–15.0)
MCH: 29.6 pg (ref 26.0–34.0)
MCHC: 34.8 g/dL (ref 30.0–36.0)
MCV: 85 fL (ref 80.0–100.0)
Platelets: 248 K/uL (ref 150–400)
RBC: 4.93 MIL/uL (ref 3.87–5.11)
RDW: 13.1 % (ref 11.5–15.5)
WBC: 9.6 K/uL (ref 4.0–10.5)
nRBC: 0 % (ref 0.0–0.2)

## 2024-08-04 LAB — HEPATITIS PANEL, ACUTE
HCV Ab: NONREACTIVE
Hep A IgM: NONREACTIVE
Hep B C IgM: NONREACTIVE
Hepatitis B Surface Ag: NONREACTIVE

## 2024-08-04 LAB — PROTIME-INR
INR: 1.1 (ref 0.8–1.2)
Prothrombin Time: 15 s (ref 11.4–15.2)

## 2024-08-04 LAB — ACETAMINOPHEN LEVEL: Acetaminophen (Tylenol), Serum: <10 ug/mL — ABNORMAL LOW (ref 10–30)

## 2024-08-04 LAB — SALICYLATE LEVEL: Salicylate Lvl: <7 mg/dL — ABNORMAL LOW (ref 7.0–30.0)

## 2024-08-04 MED ORDER — MORPHINE SULFATE (PF) 2 MG/ML IV SOLN: 2.0000 mg | INTRAVENOUS | Status: DC | PRN

## 2024-08-04 MED ORDER — ENOXAPARIN SODIUM 40 MG/0.4ML IJ SOSY
40.0000 mg | PREFILLED_SYRINGE | INTRAMUSCULAR | Status: DC
  Administered 2024-08-04 – 2024-08-06 (×3): 40 mg via SUBCUTANEOUS
  Filled 2024-08-04 (×3): qty 0.4

## 2024-08-04 MED ORDER — ONDANSETRON HCL 4 MG PO TABS: 4.0000 mg | ORAL_TABLET | Freq: Four times a day (QID) | ORAL | Status: DC | PRN

## 2024-08-04 MED ORDER — SODIUM CHLORIDE 0.9 % IV SOLN: INTRAVENOUS | Status: DC

## 2024-08-04 MED ORDER — ONDANSETRON HCL 4 MG/2ML IJ SOLN
4.0000 mg | Freq: Four times a day (QID) | INTRAMUSCULAR | Status: DC | PRN
  Administered 2024-08-05 (×2): 4 mg via INTRAVENOUS
  Filled 2024-08-04 (×2): qty 2

## 2024-08-04 MED ORDER — SODIUM CHLORIDE 0.9 % IV BOLUS: 500.0000 mL | Freq: Once | INTRAVENOUS | Status: DC

## 2024-08-04 MED ORDER — MORPHINE SULFATE (PF) 4 MG/ML IV SOLN
4.0000 mg | Freq: Once | INTRAVENOUS | Status: AC
  Administered 2024-08-04: 4 mg via INTRAVENOUS
  Filled 2024-08-04: qty 1

## 2024-08-04 MED ORDER — ONDANSETRON HCL 4 MG/2ML IJ SOLN
4.0000 mg | Freq: Once | INTRAMUSCULAR | Status: AC
  Administered 2024-08-04: 4 mg via INTRAVENOUS
  Filled 2024-08-04: qty 2

## 2024-08-04 MED ORDER — SODIUM CHLORIDE 0.9 % IV BOLUS
1000.0000 mL | Freq: Once | INTRAVENOUS | Status: AC
  Administered 2024-08-04: 1000 mL via INTRAVENOUS

## 2024-08-04 MED ORDER — THYROID 60 MG PO TABS
60.0000 mg | ORAL_TABLET | Freq: Every day | ORAL | Status: DC
  Administered 2024-08-05 – 2024-08-07 (×3): 60 mg via ORAL
  Filled 2024-08-04 (×3): qty 1

## 2024-08-04 MED ORDER — INFLUENZA VAC SPLIT HIGH-DOSE 0.5 ML IM SUSY
0.5000 mL | PREFILLED_SYRINGE | INTRAMUSCULAR | Status: AC
  Administered 2024-08-06: 0.5 mL via INTRAMUSCULAR
  Filled 2024-08-04: qty 0.5

## 2024-08-04 MED ORDER — IOHEXOL 300 MG/ML  SOLN
100.0000 mL | Freq: Once | INTRAMUSCULAR | Status: AC | PRN
  Administered 2024-08-04: 75 mL via INTRAVENOUS

## 2024-08-04 NOTE — ED Notes (Signed)
 Pt's brother Trip Delores would like to be called at [phone] with any updates regarding pt's care.

## 2024-08-04 NOTE — ED Triage Notes (Signed)
 Pt reports diarrhea and N/V starting last PM. Pt reports abd pain since last PM.

## 2024-08-04 NOTE — ED Notes (Signed)
 [facility] in ED preparing pt for transfer

## 2024-08-04 NOTE — Progress Notes (Signed)
 Plan of Care Note for accepted transfer   Patient: SILAS SEDAM MRN: [id number]   DOA: 08/04/2024  Facility requesting transfer: [facility] Requesting Provider: Lang Reason for transfer: Colitis Facility course: Patient with h/o hypothyroidism who presented with fever and abdominal pain.  Patient with concern for acute hepatitis concern - abdominal pain and markedly elevated LFTs.  Acute n/v and abdominal pain.  2-3 days of Tylenol , probably not a toxic dose but levels are pending.  Pain improved after morphine.  Since she is not confused and INR is WNL, Dr. Kriss says that she is appropriate for admission to [facility].  Will accept on med surg to either [facility] or [place].   Plan of care: The patient is accepted for admission to Med-surg  unit, at [facility]..    Author: Delon Herald, MD 08/04/2024  Check [email or web address] for on-call coverage.  Nursing staff, Please call [facility] Admits & Consults System-Wide number on Amion as soon as patient's arrival, so appropriate admitting provider can evaluate the pt.

## 2024-08-04 NOTE — ED Notes (Signed)
 [facility] in dept preparing pt for transfer.

## 2024-08-04 NOTE — ED Provider Notes (Signed)
 [facility] AT [facility] Provider Note   CSN: [id number] Arrival date & time: 08/04/24  1353     Patient presents with: Diarrhea and Abdominal Pain  HPI Wanda Downs is a 83 y.o. female with celiac's disease, hypothyroidism, fibromyalgia presenting for abdominal pain.  Started last night with nausea vomiting and diarrhea.  The pain seems to be all over the abdomen.  Denies urinary or vaginal symptoms.  Denies fever.  Denies recent use of antibiotics.  She also reports that for the last 2 days she has been taking Tylenol  3-4 times a day.  She reports that she takes 2 tablets each time.    Diarrhea Associated symptoms: abdominal pain   Abdominal Pain Associated symptoms: diarrhea        Prior to Admission medications   Medication Sig Start Date End Date Taking? Authorizing Provider  azelastine (OPTIVAR) 0.05 % ophthalmic solution Place 1 drop into both eyes Once daily as needed (for seasonal allergies). 10/08/12   [provider]  b complex vitamins capsule Take 1 capsule by mouth daily.    [provider]  benzonatate (TESSALON) 100 MG capsule Take 100 mg by mouth 3 (three) times daily as needed for cough.    [provider]  Cholecalciferol (VITAMIN D3) 1000 units CAPS Take 1,000 Units by mouth daily.    [provider]  estradiol  (VIVELLE -DOT) 0.1 MG/24HR Place 1 patch onto the skin 2 (two) times a week.    [provider]  fluticasone  (FLONASE ) 50 MCG/ACT nasal spray Place 2 sprays into the nose daily. Patient taking differently: Place 2 sprays into the nose daily as needed for allergies or rhinitis. 03/15/12   Swords, Wolm DEL, MD  lisinopril (ZESTRIL) 10 MG tablet Take 10 mg by mouth daily. 11/15/21   [provider]  Methylsulfonylmethane (MSM PO) Take 2 capsules by mouth daily.    [provider]  NEXIUM  24HR 20 MG capsule Take 20 mg by mouth at bedtime.    [provider]   NONFORMULARY OR COMPOUNDED ITEM Take 1 capsule by mouth See admin instructions. T3/T4 compounded combination capsule (9 mcg T-3 and 38 mcg T-4)- Take 1 capsule by mouth in the morning before breakfast every morning    [provider]  NONFORMULARY OR COMPOUNDED ITEM Take 40 mg by mouth See admin instructions. Compounded 40 mg Progesterone  capsule- Take 40 mg by mouth at bedtime    [provider]  thyroid  (ARMOUR) 60 MG tablet Take 60 mg by mouth daily. Take 1 tablet po daily of compounded 75 mg    [provider]  TUMS 500 MG chewable tablet Chew 1 tablet by mouth 3 (three) times daily as needed for indigestion or heartburn.    [provider]  TYLENOL  325 MG tablet Take 325-650 mg by mouth every 6 (six) hours as needed for mild pain or headache.    [provider]  VITAMIN A  PO Take 1 capsule by mouth daily.    [provider]    Allergies: Barley grass, Gluten meal, Oat, Wheat extract, Amlodipine besylate, Olmesartan medoxomil, Penicillins, and Sulfa antibiotics    Review of Systems  Gastrointestinal:  Positive for abdominal pain and diarrhea.    Physical Exam   Vitals:   08/04/24 1730 08/04/24 1809  BP: (!) 103/58   Pulse: 78   Resp: 16   Temp:  98.2 F (36.8 C)  SpO2: 100%     CONSTITUTIONAL:  well-appearing, NAD NEURO:  Alert and oriented x 3, CN 3-12 grossly intact EYES:  eyes equal and reactive ENT/NECK:  Supple, no stridor  CARDIO:  regular rate and rhythm, appears well-perfused  PULM:  No respiratory distress, CTAB GI/GU:  non-distended, soft, upper abdominal tenderness MSK/SPINE:  No gross deformities, no edema, moves all extremities  SKIN:  no rash, atraumatic   *Additional and/or pertinent findings included in MDM below   (all labs ordered are listed, but only abnormal results are displayed) Labs Reviewed  LIPASE, BLOOD - Abnormal; Notable for the following components:      Result Value   Lipase 10 (*)     All other components within normal limits  COMPREHENSIVE METABOLIC PANEL WITH GFR - Abnormal; Notable for the following components:   Sodium 124 (*)    Chloride 84 (*)    Glucose, Bld 119 (*)    AST 1,916 (*)    ALT 2,242 (*)    All other components within normal limits  ACETAMINOPHEN  LEVEL - Abnormal; Notable for the following components:   Acetaminophen  (Tylenol ), Serum <10 (*)    All other components within normal limits  SALICYLATE LEVEL - Abnormal; Notable for the following components:   Salicylate Lvl <7.0 (*)    All other components within normal limits  CBC  PROTIME-INR  URINALYSIS, ROUTINE W REFLEX MICROSCOPIC  HEPATITIS PANEL, ACUTE    EKG: None  Radiology: CT ABDOMEN PELVIS W CONTRAST Result Date: 08/04/2024 CLINICAL DATA:  Acute abdominal pain. EXAM: CT ABDOMEN AND PELVIS WITH CONTRAST TECHNIQUE: Multidetector CT imaging of the abdomen and pelvis was performed using the standard protocol following bolus administration of intravenous contrast. RADIATION DOSE REDUCTION: This exam was performed according to the departmental dose-optimization program which includes automated exposure control, adjustment of the mA and/or kV according to patient size and/or use of iterative reconstruction technique. CONTRAST:  75mL OMNIPAQUE  IOHEXOL  300 MG/ML  SOLN COMPARISON:  Abdominal CTA 10/24/2022 FINDINGS: Lower chest: Hypoventilatory changes dependently. No pleural effusion. Bilateral breast implants with suspected intracapsular rupture on the right. Hepatobiliary: No focal liver abnormality is seen. No gallstones, gallbladder wall thickening, or biliary dilatation. Pancreas: Unremarkable. No pancreatic ductal dilatation or surrounding inflammatory changes. Spleen: Normal in size without focal abnormality.  Small splenules. Adrenals/Urinary Tract: Stable left adrenal thickening. No suspicious adrenal nodule. No hydronephrosis or perinephric edema. Homogeneous renal enhancement with symmetric  excretion on delayed phase imaging. Urinary bladder is physiologically distended without wall thickening. Stomach/Bowel: Fluid distention of the stomach. Scattered fluid within small bowel. No obstruction or small bowel wall thickening. The appendix is normal. Wall thickening versus nondistention of the descending and sigmoid colon. Scattered colonic diverticula without focal diverticulitis. Vascular/Lymphatic: Aortic atherosclerosis. No aortic aneurysm. Patent portal, splenic, and mesenteric veins. No suspicious lymphadenopathy. Reproductive: Status post hysterectomy. No adnexal masses. Other: Small amount of fluid in right femoral hernia is unchanged from prior exam. The free fluid in the pelvis on prior has resolved. No free air. Musculoskeletal: Fusion of L1-L2 may be postsurgical or degenerative. Multilevel degenerative change in the spine. There are no acute or suspicious osseous abnormalities. IMPRESSION: 1. Wall thickening versus nondistention of the descending and sigmoid colon, can be seen with colitis. 2. Colonic diverticulosis without focal diverticulitis. 3. Small amount of fluid in right femoral hernia is unchanged from prior exam. Aortic Atherosclerosis (ICD10-I70.0). Electronically Signed   By: Andrea Gasman M.D.   On: 08/04/2024 16:20     Procedures   Medications Ordered in the ED  ondansetron  (ZOFRAN ) injection 4 mg (  4 mg Intravenous Given 08/04/24 1532)  morphine (PF) 4 MG/ML injection 4 mg (4 mg Intravenous Given 08/04/24 1533)  sodium chloride  0.9 % bolus 1,000 mL (0 mLs Intravenous Stopped 08/04/24 1809)  iohexol  (OMNIPAQUE ) 300 MG/ML solution 100 mL (75 mLs Intravenous Contrast Given 08/04/24 1543)                                    Medical Decision Making Amount and/or Complexity of Data Reviewed Labs: ordered. Radiology: ordered.  Risk Prescription drug management. Decision regarding hospitalization.   Initial Impression and Ddx 83 year old well-appearing female  presenting for abdominal pain, nausea vomiting diarrhea.  Exam notable for upper abdominal tenderness.  DDx includes acute cholecystitis, acute hepatitis, acute pancreatitis, kidney stone, ACS, PE, other. Patient PMH that increases complexity of ED encounter:   celiac's disease, hypothyroidism, fibromyalgia  Interpretation of Diagnostics - I independent reviewed and interpreted the labs as followed: AST 1916, ALT 2242, negative Tylenol  level, normal PT INR  - I independently visualized the following imaging with scope of interpretation limited to determining acute life threatening conditions related to emergency care: CT ab/pelvis, which revealed 1. Wall thickening versus nondistention of the descending and sigmoid colon, can be seen with colitis. 2. Colonic diverticulosis without focal diverticulitis. 3. Small amount of fluid in right femoral hernia is unchanged from prior exam  Patient Reassessment and Ultimate Disposition/Management Workup concerning for acute hepatitis and/or liver failure.  Pertinent labs are pending.  At this time she looks well, of sound mind and no witnessed vomiting here.  Vitals are normal.  Admitted her to hospital service with Dr. Barbarann.  Patient management required discussion with the following services or consulting groups:  Hospitalist Service  Complexity of Problems Addressed Acute complicated illness or Injury  Additional Data Reviewed and Analyzed Further history obtained from: Past medical history and medications listed in the EMR and Prior ED visit notes  Patient Encounter Risk Assessment Consideration of hospitalization      Final diagnoses:  Abdominal pain, unspecified abdominal location  Elevated liver enzymes    ED Discharge Orders     None          Lang Norleen POUR, PA-C 08/04/24 1827    Mannie Pac T, DO 08/11/24 [phone]

## 2024-08-04 NOTE — ED Notes (Signed)
 She is resting quietly, having just been brought back from CT. Her husband is with her.

## 2024-08-04 NOTE — ED Notes (Signed)
 Called [facility] to transport the patient to [facility] rm# [phone]

## 2024-08-04 NOTE — ED Notes (Signed)
 Patient transported to CT

## 2024-08-04 NOTE — H&P (Signed)
 History and Physical    Patient: Wanda Downs FMW:[id number] DOB: 07-Sep-1941 DOA: 08/04/2024 DOS: the patient was seen and examined on 08/04/2024 PCP: Larnell Hamilton, MD  Patient coming from: {Point_of_Origin:26777}  Chief Complaint:  Chief Complaint  Patient presents with   Diarrhea   Abdominal Pain   HPI: Wanda Downs is a 83 y.o. female with medical history significant of ***  Review of Systems: {ROS_Text:26778} Past Medical History:  Diagnosis Date   Celiac disease    Depression    Elevated LFTs    check Hep B & C   FHx: migraine headaches    Fibromyalgia    Hypothyroidism    Osteoarthritis    Osteopenia    Past Surgical History:  Procedure Laterality Date   ABDOMINAL HYSTERECTOMY     Bilateral S & O   AUGMENTATION MAMMAPLASTY Bilateral    BLADDER SURGERY     bone spur     left knee   bone spur     hand   BREAST SURGERY     Breast implants X 2   COSMETIC SURGERY     Plastic surgery (face)   EXCISION MORTON'S NEUROMA     KNEE ARTHROSCOPY     right knee   NASAL SINUS SURGERY     right hand fx     Social History:  reports that she has quit smoking. She has never used smokeless tobacco. She reports that she does not drink alcohol  and does not use drugs.  Allergies  Allergen Reactions   Barley Grass Other (See Comments)    **Celiac disease is active stage**   Gluten Meal     Other Reaction(s): Other (See Comments)  **Celiac disease is active stage**   Oat Nausea And Vomiting    **celiac disease is active stage**   Wheat Extract Other (See Comments)    **Celiac disease is active stage**   Amlodipine Besylate Other (See Comments)    Feels awful   Olmesartan Medoxomil Other (See Comments)    GI celiac issues   Penicillins Swelling, Rash and Other (See Comments)    Fever, also   Sulfa Antibiotics Nausea Only, Swelling and Rash    Family History  Problem Relation Age of Onset   Hypertension Mother    Arthritis Mother     Prior to  Admission medications   Medication Sig Start Date End Date Taking? Authorizing Provider  azelastine (OPTIVAR) 0.05 % ophthalmic solution Place 1 drop into both eyes Once daily as needed (for seasonal allergies). 10/08/12   [provider]  b complex vitamins capsule Take 1 capsule by mouth daily.    [provider]  benzonatate (TESSALON) 100 MG capsule Take 100 mg by mouth 3 (three) times daily as needed for cough.    [provider]  Cholecalciferol (VITAMIN D3) 1000 units CAPS Take 1,000 Units by mouth daily.    [provider]  estradiol  (VIVELLE -DOT) 0.1 MG/24HR Place 1 patch onto the skin 2 (two) times a week.    [provider]  fluticasone  (FLONASE ) 50 MCG/ACT nasal spray Place 2 sprays into the nose daily. Patient taking differently: Place 2 sprays into the nose daily as needed for allergies or rhinitis. 03/15/12   Swords, Wolm DEL, MD  lisinopril (ZESTRIL) 10 MG tablet Take 10 mg by mouth daily. 11/15/21   [provider]  Methylsulfonylmethane (MSM PO) Take 2 capsules by mouth daily.    [provider]  NEXIUM  24HR 20 MG capsule Take  20 mg by mouth at bedtime.    [provider]  NONFORMULARY OR COMPOUNDED ITEM Take 1 capsule by mouth See admin instructions. T3/T4 compounded combination capsule (9 mcg T-3 and 38 mcg T-4)- Take 1 capsule by mouth in the morning before breakfast every morning    [provider]  NONFORMULARY OR COMPOUNDED ITEM Take 40 mg by mouth See admin instructions. Compounded 40 mg Progesterone  capsule- Take 40 mg by mouth at bedtime    [provider]  thyroid  (ARMOUR) 60 MG tablet Take 60 mg by mouth daily. Take 1 tablet po daily of compounded 75 mg    [provider]  TUMS 500 MG chewable tablet Chew 1 tablet by mouth 3 (three) times daily as needed for indigestion or heartburn.    [provider]  TYLENOL  325 MG tablet Take 325-650 mg by mouth every 6 (six) hours  as needed for mild pain or headache.    [provider]  VITAMIN A  PO Take 1 capsule by mouth daily.    [provider]    Physical Exam: Vitals:   08/04/24 1730 08/04/24 1809 08/04/24 1830 08/04/24 1951  BP: (!) 103/58  109/75 127/77  Pulse: 78  73 77  Resp: 16  (!) 21 20  Temp:  98.2 F (36.8 C)  98.1 F (36.7 C)  TempSrc:  Oral    SpO2: 100%  100% 99%  Weight:      Height:       *** Data Reviewed: {Tip this will not be part of the note when signed- Document your independent interpretation of telemetry tracing, EKG, lab, Radiology test or any other diagnostic tests. Add any new diagnostic test ordered today. (Optional):26781} {Results:26384}  Assessment and Plan: No notes have been filed under this hospital service. Service: Hospitalist     Advance Care Planning:   Code Status: Full Code ***  Consults: ***  Family Communication: ***  Severity of Illness: {Observation/Inpatient:21159}  AuthorBETHA SIM KNOLL, MD 08/04/2024 8:35 PM  For on call review [email or web address].

## 2024-08-05 ENCOUNTER — Encounter (HOSPITAL_COMMUNITY): Payer: Self-pay | Admitting: Internal Medicine

## 2024-08-05 DIAGNOSIS — R7401 Elevation of levels of liver transaminase levels: Secondary | ICD-10-CM | POA: Diagnosis not present

## 2024-08-05 LAB — COMPREHENSIVE METABOLIC PANEL WITH GFR
ALT: 2858 U/L — ABNORMAL HIGH (ref 0–44)
AST: 1664 U/L — ABNORMAL HIGH (ref 15–41)
Albumin: 3.5 g/dL (ref 3.5–5.0)
Alkaline Phosphatase: 69 U/L (ref 38–126)
Anion gap: 10 (ref 5–15)
BUN: 9 mg/dL (ref 8–23)
CO2: 23 mmol/L (ref 22–32)
Calcium: 8.5 mg/dL — ABNORMAL LOW (ref 8.9–10.3)
Chloride: 97 mmol/L — ABNORMAL LOW (ref 98–111)
Creatinine, Ser: 0.57 mg/dL (ref 0.44–1.00)
GFR, Estimated: >60 mL/min (ref >60)
Glucose, Bld: 91 mg/dL (ref 70–99)
Potassium: 3.5 mmol/L (ref 3.5–5.1)
Sodium: 130 mmol/L — ABNORMAL LOW (ref 135–145)
Total Bilirubin: 0.3 mg/dL (ref 0.0–1.2)
Total Protein: 6.1 g/dL — ABNORMAL LOW (ref 6.5–8.1)

## 2024-08-05 LAB — CBC
HCT: 40.1 % (ref 36.0–46.0)
Hemoglobin: 13.4 g/dL (ref 12.0–15.0)
MCH: 29.7 pg (ref 26.0–34.0)
MCHC: 33.4 g/dL (ref 30.0–36.0)
MCV: 88.9 fL (ref 80.0–100.0)
Platelets: 224 K/uL (ref 150–400)
RBC: 4.51 MIL/uL (ref 3.87–5.11)
RDW: 13.4 % (ref 11.5–15.5)
WBC: 7.4 K/uL (ref 4.0–10.5)
nRBC: 0 % (ref 0.0–0.2)

## 2024-08-05 LAB — VITAMIN B12: Vitamin B-12: >4000 pg/mL — ABNORMAL HIGH (ref 180–914)

## 2024-08-05 LAB — TSH: TSH: 2.49 u[IU]/mL (ref 0.350–4.500)

## 2024-08-05 LAB — FOLATE: Folate: 17.8 ng/mL (ref >5.9)

## 2024-08-05 MED ORDER — METRONIDAZOLE 500 MG/100ML IV SOLN
500.0000 mg | Freq: Two times a day (BID) | INTRAVENOUS | Status: DC
  Administered 2024-08-05: 500 mg via INTRAVENOUS
  Filled 2024-08-05: qty 100

## 2024-08-05 MED ORDER — POLYVINYL ALCOHOL 1.4 % OP SOLN
1.0000 [drp] | OPHTHALMIC | Status: DC | PRN
  Filled 2024-08-05: qty 15

## 2024-08-05 MED ORDER — SODIUM CHLORIDE 0.9 % IV SOLN
2.0000 g | INTRAVENOUS | Status: DC
  Administered 2024-08-05: 2 g via INTRAVENOUS
  Filled 2024-08-05: qty 20

## 2024-08-05 MED ORDER — DEXTROSE 5 % IV SOLN
15.0000 mg/kg/h | INTRAVENOUS | Status: DC
  Administered 2024-08-05: 15 mg/kg/h via INTRAVENOUS
  Filled 2024-08-05: qty 90

## 2024-08-05 MED ORDER — SODIUM CHLORIDE 0.9 % IV SOLN: INTRAVENOUS | Status: AC

## 2024-08-05 MED ORDER — ACETYLCYSTEINE LOAD VIA INFUSION
150.0000 mg/kg | Freq: Once | INTRAVENOUS | Status: AC
  Administered 2024-08-05: 7005 mg via INTRAVENOUS
  Filled 2024-08-05: qty 230

## 2024-08-05 NOTE — Progress Notes (Signed)
 Patient called and states that she feels burning inside. Feels impending doom and requests that the acetylcysteine infusion be stopped; MD notified; called poison control center and spoke with RN; Will await additional orders; IV infusion paused at this time

## 2024-08-05 NOTE — Progress Notes (Signed)
 MEDICATION RELATED CONSULT NOTE  Pharmacy Consult for NAC Indication: potential APAP toxicity  Allergies  Allergen Reactions   Barley Grass Other (See Comments)    **Celiac disease is active stage**   Gluten Meal     Other Reaction(s): Other (See Comments)  **Celiac disease is active stage**   Oat Nausea And Vomiting    **celiac disease is active stage**   Wheat Extract Other (See Comments)    **Celiac disease is active stage**   Amlodipine Besylate Other (See Comments)    Feels awful   Olmesartan Medoxomil Other (See Comments)    GI celiac issues   Penicillins Swelling, Rash and Other (See Comments)    Fever, also   Sulfa Antibiotics Nausea Only, Swelling and Rash    Patient Measurements: Height: 5' 1 (154.9 cm) Weight: 46.7 kg (103 lb) IBW/kg (Calculated) : 47.8 Adjusted Body Weight:    Vital Signs: Temp: 97.4 F (36.3 C) (10/27 1535) Temp Source: Oral (10/27 1535) BP: 137/76 (10/27 1535) Pulse Rate: 75 (10/27 1535) Intake/Output from previous day: 10/26 0701 - 10/27 0700 In: 1240 [P.O.:240; IV Piggyback:1000] Out: -  Intake/Output from this shift: Total I/O In: 2145 [P.O.:240; I.V.:1705; IV Piggyback:200.1] Out: -   Labs: Recent Labs    08/04/24 1430 08/05/24 0436  WBC 9.6 7.4  HGB 14.6 13.4  HCT 41.9 40.1  PLT 248 224  CREATININE 0.66 0.57  ALBUMIN 4.5 3.5  PROT 7.4 6.1*  AST 1,916* 1,664*  ALT 2,242* 2,858*  ALKPHOS 85 69  BILITOT 0.6 0.3   Estimated Creatinine Clearance: 40 mL/min (by C-G formula based on SCr of 0.57 mg/dL).   Microbiology: No results found for this or any previous visit (from the past 720 hours).  Medical History: Past Medical History:  Diagnosis Date   Celiac disease    Depression    Elevated LFTs    check Hep B & C   FHx: migraine headaches    Fibromyalgia    Hypothyroidism    Osteoarthritis    Osteopenia     Assessment: Chronic APAP OD protocol per Poison Control because her LFT's are >3xULN    Goal of Therapy:  Reversal of LFT's and liver damage  Plan:  NAC 150mg /kg bolus then 15mg /kg/hr infusion x 24hr. Will reconsult with Poison Control after labs (APAP level, AST/ALT) checked at 22 hrs.  Crystal Karoline Marina, PharmD, BCPS Clinical Staff Pharmacist  UPDATE:   Patient experiencing hot, burning sensation and dry after receiving bolus dose of Acetadote.  RN paused infusion ~ 30 min ago.  Pt reports burning sensation has resolved.  Still with dry mouth.   Poison control recommends resuming infusion at 1/2 rate (7.5mg /kg/hr) and re-assessing patient every 30min - 1h for symptoms and as symptoms allow can increase by 2.5mg /kg/hr back to goal rate of 15mg /kg/hr.  Rosaline Millet, PharmD, BCPS 08/05/2024,4:11 PM

## 2024-08-05 NOTE — Plan of Care (Signed)

## 2024-08-05 NOTE — Progress Notes (Signed)
 Patient was complaining of dry mouth, dizziness and impending doom and requested to stop NAC even after reducing the dose . Will stop NAC, transfer to tele . Will discuss with pharmacy and poison control if there is other way of administration

## 2024-08-05 NOTE — Progress Notes (Addendum)
 PROGRESS NOTE    Wanda Downs  FMW:[id number] DOB: June 01, 1941 DOA: 08/04/2024 PCP: Larnell Hamilton, MD    Brief Narrative:  83 year old with history of celiac disease, depression, fibromyalgia, hypothyroidism presents to the emergency room with sudden onset of nausea and violent vomiting with mild abdominal pain.  No other symptoms.  Afebrile.  No sick contacts.  Patient was recently taking Tylenol  every 4 hours, she is not sure how she was taking however stated that she was taking only for 1 day.  In the emergency room transaminases elevated to 2000, sodium 126, normal bilirubin.  Hemodynamically stable.  Tylenol  level was less than 7.  Admitted for symptomatic treatment.  Subjective: Patient seen and examined.  Denies any complaints.  Denies any nausea vomiting or abdominal pain.  Wants to go home. AST downtrending, ALT slightly elevated.  Bilirubin still normal.  INR normal.  Called and updated by patient's brother . Patient is taking too many supplements and might have been taking supplements with tylenol  or substance to cause liver failure. Also requested neurocognitive evaluation.   Assessment & Plan:   Nausea vomiting, acute transaminitis: Suspected Tylenol  induced injury, Tylenol  level nontoxic.  Could be hypovolemic and shock liver as she presented with sodium of 126.  Clinically stabilizing.  Continue IV fluids.  Continue monitoring liver function tests. GI to follow-up.  Likely conservative management. Today, patient is already 24 hours of tylenol  ingestion if any and her presentation tylenol  level was <7, likely no benefit with N- acetylcysteine. Will connect to poison control.   Hypochloremic hyponatremia with clinical dehydration: Treated with isotonic fluids.  Levels of renal improving.  Will monitor.  Continue Rocephin today.  Hypothyroidism: On thyroid  replacement.  Check TSH level today.  Failure to thrive and weight loss: Check B12, folic acid  and TSH levels.  PT  OT. CT scan of abdomen did not show any evidence of liver disease or complication. Suspected colitis, however no clinical evidence.  Discontinue antibiotics.   DVT prophylaxis: enoxaparin (LOVENOX) injection 40 mg Start: 08/04/24 2200   Code Status: Full code  Family Communication: Brother called , unable to reach  Disposition Plan: Status is: Inpatient Remains inpatient appropriate because: Significantly elevated LFTs, monitoring     Consultants:  Gastroenterology   Procedures:  None  Antimicrobials:  Rocephin and Flagyl  10/26-10/27.     Objective: Vitals:   08/05/24 0008 08/05/24 0416 08/05/24 0756 08/05/24 1236  BP: 112/61 113/68 106/67 115/69  Pulse: 68 76 70 73  Resp: 16 14 16 17   Temp: (!) 97.3 F (36.3 C) 98.1 F (36.7 C) 98.8 F (37.1 C) 99 F (37.2 C)  TempSrc:   Oral Oral  SpO2: 100% 97% 98% 99%  Weight:      Height:        Intake/Output Summary (Last 24 hours) at 08/05/2024 1236 Last data filed at 08/05/2024 0700 Gross per 24 hour  Intake 1240 ml  Output --  Net 1240 ml   Filed Weights   08/04/24 1359  Weight: 46.7 kg    Examination:  General exam: Appears calm and comfortable.  Frail.  Thinly built.  Respiratory system: Clear to auscultation. Respiratory effort normal. Cardiovascular system: S1 & S2 heard, RRR. No JVD, murmurs, rubs, gallops or clicks. No pedal edema. Gastrointestinal system: Abdomen is nondistended, soft and nontender. No organomegaly or masses felt. Normal bowel sounds heard. Central nervous system: Alert and oriented. No focal neurological deficits.      Data Reviewed: I have personally reviewed following labs  and imaging studies  CBC: Recent Labs  Lab 08/04/24 1430 08/05/24 0436  WBC 9.6 7.4  HGB 14.6 13.4  HCT 41.9 40.1  MCV 85.0 88.9  PLT 248 224   Basic Metabolic Panel: Recent Labs  Lab 08/04/24 1430 08/05/24 0436  NA 124* 130*  K 3.8 3.5  CL 84* 97*  CO2 28 23  GLUCOSE 119* 91  BUN 8 9   CREATININE 0.66 0.57  CALCIUM 9.6 8.5*   GFR: Estimated Creatinine Clearance: 40 mL/min (by C-G formula based on SCr of 0.57 mg/dL). Liver Function Tests: Recent Labs  Lab 08/04/24 1430 08/05/24 0436  AST 1,916* 1,664*  ALT 2,242* 2,858*  ALKPHOS 85 69  BILITOT 0.6 0.3  PROT 7.4 6.1*  ALBUMIN 4.5 3.5   Recent Labs  Lab 08/04/24 1430  LIPASE 10*   No results for input(s): AMMONIA in the last 168 hours. Coagulation Profile: Recent Labs  Lab 08/04/24 1430  INR 1.1   Cardiac Enzymes: No results for input(s): CKTOTAL, CKMB, CKMBINDEX, TROPONINI in the last 168 hours. BNP (last 3 results) No results for input(s): PROBNP in the last 8760 hours. HbA1C: No results for input(s): HGBA1C in the last 72 hours. CBG: No results for input(s): GLUCAP in the last 168 hours. Lipid Profile: No results for input(s): CHOL, HDL, LDLCALC, TRIG, CHOLHDL, LDLDIRECT in the last 72 hours. Thyroid  Function Tests: No results for input(s): TSH, T4TOTAL, FREET4, T3FREE, THYROIDAB in the last 72 hours. Anemia Panel: No results for input(s): VITAMINB12, FOLATE, FERRITIN, TIBC, IRON, RETICCTPCT in the last 72 hours. Sepsis Labs: No results for input(s): PROCALCITON, LATICACIDVEN in the last 168 hours.  No results found for this or any previous visit (from the past 240 hours).       Radiology Studies: CT ABDOMEN PELVIS W CONTRAST Result Date: 08/04/2024 CLINICAL DATA:  Acute abdominal pain. EXAM: CT ABDOMEN AND PELVIS WITH CONTRAST TECHNIQUE: Multidetector CT imaging of the abdomen and pelvis was performed using the standard protocol following bolus administration of intravenous contrast. RADIATION DOSE REDUCTION: This exam was performed according to the departmental dose-optimization program which includes automated exposure control, adjustment of the mA and/or kV according to patient size and/or use of iterative reconstruction technique.  CONTRAST:  75mL OMNIPAQUE  IOHEXOL  300 MG/ML  SOLN COMPARISON:  Abdominal CTA 10/24/2022 FINDINGS: Lower chest: Hypoventilatory changes dependently. No pleural effusion. Bilateral breast implants with suspected intracapsular rupture on the right. Hepatobiliary: No focal liver abnormality is seen. No gallstones, gallbladder wall thickening, or biliary dilatation. Pancreas: Unremarkable. No pancreatic ductal dilatation or surrounding inflammatory changes. Spleen: Normal in size without focal abnormality.  Small splenules. Adrenals/Urinary Tract: Stable left adrenal thickening. No suspicious adrenal nodule. No hydronephrosis or perinephric edema. Homogeneous renal enhancement with symmetric excretion on delayed phase imaging. Urinary bladder is physiologically distended without wall thickening. Stomach/Bowel: Fluid distention of the stomach. Scattered fluid within small bowel. No obstruction or small bowel wall thickening. The appendix is normal. Wall thickening versus nondistention of the descending and sigmoid colon. Scattered colonic diverticula without focal diverticulitis. Vascular/Lymphatic: Aortic atherosclerosis. No aortic aneurysm. Patent portal, splenic, and mesenteric veins. No suspicious lymphadenopathy. Reproductive: Status post hysterectomy. No adnexal masses. Other: Small amount of fluid in right femoral hernia is unchanged from prior exam. The free fluid in the pelvis on prior has resolved. No free air. Musculoskeletal: Fusion of L1-L2 may be postsurgical or degenerative. Multilevel degenerative change in the spine. There are no acute or suspicious osseous abnormalities. IMPRESSION: 1. Wall thickening versus nondistention of  the descending and sigmoid colon, can be seen with colitis. 2. Colonic diverticulosis without focal diverticulitis. 3. Small amount of fluid in right femoral hernia is unchanged from prior exam. Aortic Atherosclerosis (ICD10-I70.0). Electronically Signed   By: Andrea Gasman M.D.    On: 08/04/2024 16:20        Scheduled Meds:  acetylcysteine  150 mg/kg Intravenous Once   enoxaparin (LOVENOX) injection  40 mg Subcutaneous Q24H   Influenza vac split trivalent PF  0.5 mL Intramuscular Tomorrow-1000   thyroid   60 mg Oral Daily   Continuous Infusions:  sodium chloride  100 mL/hr at 08/04/24 2047   acetylcysteine       LOS: 1 day    Time spent: 51 minutes     Renato Applebaum, MD [facility]

## 2024-08-05 NOTE — Plan of Care (Signed)
   Problem: Education: Goal: Knowledge of General Education information will improve Description: Including pain rating scale, medication(s)/side effects and non-pharmacologic comfort measures Outcome: Progressing   Problem: Pain Managment: Goal: General experience of comfort will improve and/or be controlled Outcome: Progressing   Problem: Safety: Goal: Ability to remain free from injury will improve Outcome: Progressing

## 2024-08-05 NOTE — Progress Notes (Signed)
 MEDICATION RELATED CONSULT NOTE - INITIAL   Pharmacy Consult for NAC Indication: potential APAP toxicity  Allergies  Allergen Reactions   Barley Grass Other (See Comments)    **Celiac disease is active stage**   Gluten Meal     Other Reaction(s): Other (See Comments)  **Celiac disease is active stage**   Oat Nausea And Vomiting    **celiac disease is active stage**   Wheat Extract Other (See Comments)    **Celiac disease is active stage**   Amlodipine Besylate Other (See Comments)    Feels awful   Olmesartan Medoxomil Other (See Comments)    GI celiac issues   Penicillins Swelling, Rash and Other (See Comments)    Fever, also   Sulfa Antibiotics Nausea Only, Swelling and Rash    Patient Measurements: Height: 5' 1 (154.9 cm) Weight: 46.7 kg (103 lb) IBW/kg (Calculated) : 47.8 Adjusted Body Weight:    Vital Signs: Temp: 98.8 F (37.1 C) (10/27 0756) Temp Source: Oral (10/27 0756) BP: 106/67 (10/27 0756) Pulse Rate: 70 (10/27 0756) Intake/Output from previous day: 10/26 0701 - 10/27 0700 In: 1240 [P.O.:240; IV Piggyback:1000] Out: -  Intake/Output from this shift: No intake/output data recorded.  Labs: Recent Labs    08/04/24 1430 08/05/24 0436  WBC 9.6 7.4  HGB 14.6 13.4  HCT 41.9 40.1  PLT 248 224  CREATININE 0.66 0.57  ALBUMIN 4.5 3.5  PROT 7.4 6.1*  AST 1,916* 1,664*  ALT 2,242* 2,858*  ALKPHOS 85 69  BILITOT 0.6 0.3   Estimated Creatinine Clearance: 40 mL/min (by C-G formula based on SCr of 0.57 mg/dL).   Microbiology: No results found for this or any previous visit (from the past 720 hours).  Medical History: Past Medical History:  Diagnosis Date   Celiac disease    Depression    Elevated LFTs    check Hep B & C   FHx: migraine headaches    Fibromyalgia    Hypothyroidism    Osteoarthritis    Osteopenia     Assessment: Active Problem(s): diarrhea, N/V, last 2 days she has been taking Tylenol  3-4 times a day.  She reports that  she takes 2 tablets each time.   Discussed with Poison  Control: Chronic APAP OD protocol per Poison Control because her LFT's are >3xULN   Goal of Therapy:  Reversal of LFT's and liver damage  Plan:  NAC 150mg /kg bolus then 15mg /kg/hr infusion x 24hr. Will reconsult with Poison Control after labs (APAP level, AST/ALT) checked at 22 hrs.  Anothony Bursch Karoline Marina, PharmD, BCPS Clinical Staff Pharmacist Marina Camelia Karoline 08/05/2024,12:13 PM

## 2024-08-05 NOTE — Consult Note (Signed)
 Reason for Consult: Elevated transaminases Referring Physician: Hospital team  Wanda Downs is an 83 y.o. female.  HPI: Patient feeling much better and wants to go home and her hospital computer chart reviewed and she had a bout of C. difficile last year too but has not been on antibiotics recently but did have some violent vomiting at home and did take some Tylenol  and the ER doctor reached out via care everywhere and Dr. Kriss had recommended them start N-acetylcysteine however that was not done and she is tolerating her diet and is not having diarrhea and has no sick contacts and no other complaints  Past Medical History:  Diagnosis Date   Celiac disease    Depression    Elevated LFTs    check Hep B & C   FHx: migraine headaches    Fibromyalgia    Hypothyroidism    Osteoarthritis    Osteopenia     Past Surgical History:  Procedure Laterality Date   ABDOMINAL HYSTERECTOMY     Bilateral S & O   AUGMENTATION MAMMAPLASTY Bilateral    BLADDER SURGERY     bone spur     left knee   bone spur     hand   BREAST SURGERY     Breast implants X 2   COSMETIC SURGERY     Plastic surgery (face)   EXCISION [facility]     KNEE ARTHROSCOPY     right knee   NASAL SINUS SURGERY     right hand fx      Family History  Problem Relation Age of Onset   Hypertension Mother    Arthritis Mother     Social History:  reports that she has quit smoking. She has never used smokeless tobacco. She reports that she does not drink alcohol  and does not use drugs.  Allergies:  Allergies  Allergen Reactions   Barley Grass Other (See Comments)    **Celiac disease is active stage**   Gluten Meal     Other Reaction(s): Other (See Comments)  **Celiac disease is active stage**   Oat Nausea And Vomiting    **celiac disease is active stage**   Wheat Extract Other (See Comments)    **Celiac disease is active stage**   Amlodipine Besylate Other (See Comments)    Feels awful    Olmesartan Medoxomil Other (See Comments)    GI celiac issues   Penicillins Swelling, Rash and Other (See Comments)    Fever, also   Sulfa Antibiotics Nausea Only, Swelling and Rash    Medications: I have reviewed the patient's current medications.  Results for orders placed or performed during the hospital encounter of 08/04/24 (from the past 48 hours)  Lipase, blood     Status: Abnormal   Collection Time: 08/04/24  2:30 PM  Result Value Ref Range   Lipase 10 (L) 11 - 51 U/L    Comment: Performed at [facility], [address] [place], [place] [address]  Comprehensive metabolic panel     Status: Abnormal   Collection Time: 08/04/24  2:30 PM  Result Value Ref Range   Sodium 124 (L) 135 - 145 mmol/L   Potassium 3.8 3.5 - 5.1 mmol/L   Chloride 84 (L) 98 - 111 mmol/L   CO2 28 22 - 32 mmol/L   Glucose, Bld 119 (H) 70 - 99 mg/dL    Comment: Glucose reference range applies only to samples taken after fasting for at least 8 hours.  BUN 8 8 - 23 mg/dL   Creatinine, Ser 9.33 0.44 - 1.00 mg/dL   Calcium 9.6 8.9 - 89.6 mg/dL   Total Protein 7.4 6.5 - 8.1 g/dL   Albumin 4.5 3.5 - 5.0 g/dL   AST 8,083 (H) 15 - 41 U/L   ALT 2,242 (H) 0 - 44 U/L   Alkaline Phosphatase 85 38 - 126 U/L   Total Bilirubin 0.6 0.0 - 1.2 mg/dL   GFR, Estimated >39 >39 mL/min    Comment: (NOTE) Calculated using the CKD-EPI Creatinine Equation (2021)    Anion gap 12 5 - 15    Comment: Performed at [facility], [address] [place], [place] [address]  CBC     Status: None   Collection Time: 08/04/24  2:30 PM  Result Value Ref Range   WBC 9.6 4.0 - 10.5 K/uL   RBC 4.93 3.87 - 5.11 MIL/uL   Hemoglobin 14.6 12.0 - 15.0 g/dL   HCT 58.0 63.9 - 53.9 %   MCV 85.0 80.0 - 100.0 fL   MCH 29.6 26.0 - 34.0 pg   MCHC 34.8 30.0 - 36.0 g/dL   RDW 86.8 88.4 - 84.4 %   Platelets 248 150 - 400 K/uL   nRBC 0.0 0.0 - 0.2 %    Comment: Performed at [facility], [address], [place], [place] [address]  Hepatitis panel, acute     Status: None   Collection Time: 08/04/24  2:30 PM  Result Value Ref Range   Hepatitis B Surface Ag NON REACTIVE NON REACTIVE   HCV Ab NON REACTIVE NON REACTIVE    Comment: (NOTE) Nonreactive HCV antibody screen is consistent with no HCV infections,  unless recent infection is suspected or other evidence exists to indicate HCV infection.     Hep A IgM NON REACTIVE NON REACTIVE   Hep B C IgM NON REACTIVE NON REACTIVE    Comment: Performed at [facility], [address] [place], [place] [address]  Protime-INR     Status: None   Collection Time: 08/04/24  2:30 PM  Result Value Ref Range   Prothrombin Time 15.0 11.4 - 15.2 seconds   INR 1.1 0.8 - 1.2    Comment: (NOTE) INR goal varies based on device and disease states. Performed at [facility], [address] [place], [place] [address]   Acetaminophen  level     Status: Abnormal   Collection Time: 08/04/24  4:50 PM  Result Value Ref Range   Acetaminophen  (Tylenol ), Serum <10 (L) 10 - 30 ug/mL    Comment: Performed at [facility], [address], [place], [place] [address]  Salicylate level     Status: Abnormal   Collection Time: 08/04/24  4:50 PM  Result Value Ref Range   Salicylate Lvl <7.0 (L) 7.0 - 30.0 mg/dL    Comment: Performed at [facility], [address] [place], [place] [address]  CBC     Status: None   Collection Time: 08/05/24  4:36 AM  Result Value Ref Range   WBC 7.4 4.0 - 10.5 K/uL   RBC 4.51 3.87 - 5.11 MIL/uL   Hemoglobin 13.4 12.0 - 15.0 g/dL   HCT 59.8 63.9 - 53.9 %   MCV 88.9 80.0 - 100.0 fL   MCH 29.7 26.0 - 34.0 pg   MCHC 33.4 30.0 - 36.0 g/dL   RDW 86.5 88.4 - 84.4 %   Platelets 224 150 - 400 K/uL  nRBC 0.0 0.0 - 0.2 %    Comment: Performed at [facility], [address], [place], [place] [address]  Comprehensive metabolic  panel     Status: Abnormal   Collection Time: 08/05/24  4:36 AM  Result Value Ref Range   Sodium 130 (L) 135 - 145 mmol/L   Potassium 3.5 3.5 - 5.1 mmol/L   Chloride 97 (L) 98 - 111 mmol/L   CO2 23 22 - 32 mmol/L   Glucose, Bld 91 70 - 99 mg/dL    Comment: Glucose reference range applies only to samples taken after fasting for at least 8 hours.   BUN 9 8 - 23 mg/dL   Creatinine, Ser 9.42 0.44 - 1.00 mg/dL   Calcium 8.5 (L) 8.9 - 10.3 mg/dL   Total Protein 6.1 (L) 6.5 - 8.1 g/dL   Albumin 3.5 3.5 - 5.0 g/dL   AST 8,335 (H) 15 - 41 U/L   ALT 2,858 (H) 0 - 44 U/L   Alkaline Phosphatase 69 38 - 126 U/L   Total Bilirubin 0.3 0.0 - 1.2 mg/dL   GFR, Estimated >39 >39 mL/min    Comment: (NOTE) Calculated using the CKD-EPI Creatinine Equation (2021)    Anion gap 10 5 - 15    Comment: Performed at [facility], [address] [place], [place] [address]    CT ABDOMEN PELVIS W CONTRAST Result Date: 08/04/2024 CLINICAL DATA:  Acute abdominal pain. EXAM: CT ABDOMEN AND PELVIS WITH CONTRAST TECHNIQUE: Multidetector CT imaging of the abdomen and pelvis was performed using the standard protocol following bolus administration of intravenous contrast. RADIATION DOSE REDUCTION: This exam was performed according to the departmental dose-optimization program which includes automated exposure control, adjustment of the mA and/or kV according to patient size and/or use of iterative reconstruction technique. CONTRAST:  75mL OMNIPAQUE  IOHEXOL  300 MG/ML  SOLN COMPARISON:  Abdominal CTA 10/24/2022 FINDINGS: Lower chest: Hypoventilatory changes dependently. No pleural effusion. Bilateral breast implants with suspected intracapsular rupture on the right. Hepatobiliary: No focal liver abnormality is seen. No gallstones, gallbladder wall thickening, or biliary dilatation. Pancreas: Unremarkable. No pancreatic ductal dilatation or surrounding inflammatory changes. Spleen: Normal in size without focal  abnormality.  Small splenules. Adrenals/Urinary Tract: Stable left adrenal thickening. No suspicious adrenal nodule. No hydronephrosis or perinephric edema. Homogeneous renal enhancement with symmetric excretion on delayed phase imaging. Urinary bladder is physiologically distended without wall thickening. Stomach/Bowel: Fluid distention of the stomach. Scattered fluid within small bowel. No obstruction or small bowel wall thickening. The appendix is normal. Wall thickening versus nondistention of the descending and sigmoid colon. Scattered colonic diverticula without focal diverticulitis. Vascular/Lymphatic: Aortic atherosclerosis. No aortic aneurysm. Patent portal, splenic, and mesenteric veins. No suspicious lymphadenopathy. Reproductive: Status post hysterectomy. No adnexal masses. Other: Small amount of fluid in right femoral hernia is unchanged from prior exam. The free fluid in the pelvis on prior has resolved. No free air. Musculoskeletal: Fusion of L1-L2 may be postsurgical or degenerative. Multilevel degenerative change in the spine. There are no acute or suspicious osseous abnormalities. IMPRESSION: 1. Wall thickening versus nondistention of the descending and sigmoid colon, can be seen with colitis. 2. Colonic diverticulosis without focal diverticulitis. 3. Small amount of fluid in right femoral hernia is unchanged from prior exam. Aortic Atherosclerosis (ICD10-I70.0). Electronically Signed   By: Andrea Gasman M.D.   On: 08/04/2024 16:20    ROS negative except above Blood pressure 106/67, pulse 70, temperature 98.8 F (37.1 C), temperature source Oral, resp.  rate 16, height 5' 1 (1.549 m), weight 46.7 kg, SpO2 98%. Physical Exam vital signs stable afebrile no acute distress abdomen is soft nontender  Assessment/Plan: Significantly elevated transaminases with normal bilirubin and INR most likely causes include viral Tylenol  or shock liver from hypotension Plan: Even though it was not started  right away when she presented N-acetylcysteine i.e. Mucomyst was recommended and you may want to check with poison control to see if it is worth starting this late otherwise she probably has shock liver and would watch at least 1 more day till both transaminases start decreasing and will check on tomorrow [facility] E 08/05/2024, 11:19 AM

## 2024-08-06 DIAGNOSIS — R7401 Elevation of levels of liver transaminase levels: Secondary | ICD-10-CM | POA: Diagnosis not present

## 2024-08-06 LAB — CBC WITH DIFFERENTIAL/PLATELET
Abs Immature Granulocytes: 0.02 K/uL (ref 0.00–0.07)
Basophils Absolute: 0 K/uL (ref 0.0–0.1)
Basophils Relative: 1 %
Eosinophils Absolute: 0.1 K/uL (ref 0.0–0.5)
Eosinophils Relative: 1 %
HCT: 37.5 % (ref 36.0–46.0)
Hemoglobin: 12.9 g/dL (ref 12.0–15.0)
Immature Granulocytes: 0 %
Lymphocytes Relative: 21 %
Lymphs Abs: 1.6 K/uL (ref 0.7–4.0)
MCH: 30 pg (ref 26.0–34.0)
MCHC: 34.4 g/dL (ref 30.0–36.0)
MCV: 87.2 fL (ref 80.0–100.0)
Monocytes Absolute: 0.3 K/uL (ref 0.1–1.0)
Monocytes Relative: 4 %
Neutro Abs: 5.5 K/uL (ref 1.7–7.7)
Neutrophils Relative %: 73 %
Platelets: 208 K/uL (ref 150–400)
RBC: 4.3 MIL/uL (ref 3.87–5.11)
RDW: 13.7 % (ref 11.5–15.5)
WBC: 7.5 K/uL (ref 4.0–10.5)
nRBC: 0 % (ref 0.0–0.2)

## 2024-08-06 LAB — COMPREHENSIVE METABOLIC PANEL WITH GFR
ALT: 2497 U/L — ABNORMAL HIGH (ref 0–44)
AST: 985 U/L — ABNORMAL HIGH (ref 15–41)
Albumin: 3.4 g/dL — ABNORMAL LOW (ref 3.5–5.0)
Alkaline Phosphatase: 65 U/L (ref 38–126)
Anion gap: 10 (ref 5–15)
BUN: 8 mg/dL (ref 8–23)
CO2: 23 mmol/L (ref 22–32)
Calcium: 8.1 mg/dL — ABNORMAL LOW (ref 8.9–10.3)
Chloride: 99 mmol/L (ref 98–111)
Creatinine, Ser: 0.53 mg/dL (ref 0.44–1.00)
GFR, Estimated: >60 mL/min (ref >60)
Glucose, Bld: 101 mg/dL — ABNORMAL HIGH (ref 70–99)
Potassium: 3.4 mmol/L — ABNORMAL LOW (ref 3.5–5.1)
Sodium: 132 mmol/L — ABNORMAL LOW (ref 135–145)
Total Bilirubin: 0.3 mg/dL (ref 0.0–1.2)
Total Protein: 5.9 g/dL — ABNORMAL LOW (ref 6.5–8.1)

## 2024-08-06 LAB — HEPATIC FUNCTION PANEL
ALT: 2497 U/L — ABNORMAL HIGH (ref 0–44)
AST: 986 U/L — ABNORMAL HIGH (ref 15–41)
Albumin: 3.3 g/dL — ABNORMAL LOW (ref 3.5–5.0)
Alkaline Phosphatase: 64 U/L (ref 38–126)
Bilirubin, Direct: 0.1 mg/dL (ref 0.0–0.2)
Indirect Bilirubin: 0.3 mg/dL (ref 0.3–0.9)
Total Bilirubin: 0.4 mg/dL (ref 0.0–1.2)
Total Protein: 5.7 g/dL — ABNORMAL LOW (ref 6.5–8.1)

## 2024-08-06 LAB — PROTIME-INR
INR: 1.2 (ref 0.8–1.2)
Prothrombin Time: 16.2 s — ABNORMAL HIGH (ref 11.4–15.2)

## 2024-08-06 MED ORDER — POTASSIUM CHLORIDE CRYS ER 20 MEQ PO TBCR
20.0000 meq | EXTENDED_RELEASE_TABLET | Freq: Two times a day (BID) | ORAL | Status: DC
  Administered 2024-08-06 – 2024-08-07 (×3): 20 meq via ORAL
  Filled 2024-08-06 (×4): qty 1

## 2024-08-06 MED ORDER — ORAL CARE MOUTH RINSE: 15.0000 mL | OROMUCOSAL | Status: DC | PRN

## 2024-08-06 NOTE — Plan of Care (Signed)

## 2024-08-06 NOTE — Progress Notes (Signed)
       Overnight   NAME: Wanda Downs MRN: [id number] DOB : 12-Nov-1940    Date of Service   08/06/2024   HPI/Events of Note    Notified by RN for call from poison control.  Poison control request PT/INR stat, LFTs stat. Earlier call from poison control was directed to pharmacy at their request.   Interventions/ Plan   LFT stat PT/INR stat       Lynwood Kipper BSN MSNA MSN ACNPC-AG Acute Care Nurse Practitioner [facility]

## 2024-08-06 NOTE — Progress Notes (Signed)
 Wanda Downs 12:04 PM  Subjective: Patient doing well and her case discussed with her brother and her golfing friend who works here and she has no complaints although had some side effects from the Mucomyst and we had a long talk about liver failure or recovery and we reviewed all of her vitamin supplements etc. and we tried to answer all of her questions  Objective: Signs stable afebrile no acute distress abdomen is soft nontender LFTs slight decrease bili and INR slight increase  Assessment: Shock liver versus toxic digestion Plan: I tried to explain that she will either recover or she will not and although that I expect the liver test to continue to decrease the worry something will be when the bilirubin and the INR continue to rise and I do not think she is a reliable outpatient close follow-up even though that is her preference and I will check on tomorrow and please call me if any specific GI question or problem  [facility] E  office [phone] After 5PM or if no answer call [phone]

## 2024-08-06 NOTE — Evaluation (Signed)
 Physical Therapy Evaluation Patient Details Name: Wanda Downs MRN: [id number] DOB: Mar 07, 1941 Today's Date: 08/06/2024  History of Present Illness  83 yo female presents to therapy following hospital admission on 08/04/2024 due to a sudden violent onset of vomiting and abdominal pain. Pt was noted to have elevated LFTs, dehydrated and some hyponatremia possibly due to poor medication management. Pt PMH includes but is not limited to: celiacs dz, recurrent elevation of LFTs, fibromyalgia, hypothyroidism, OP, and depression.  Clinical Impression      Pt admitted with above diagnosis.  Pt currently with functional limitations due to the deficits listed below (see PT Problem List). Pt in bed when PT arrived. Pt agreeable to therapy intervention. Pt is mod I for bed mobility, S for transfer tasks to a variety of surfaces with min cues, gait tasks 400 feet without AD no overt LOB and min cues, step navigation x 12 with R handrail and min cues. Pt indicated no pain, dizziness nor SOB. Pt is motivated to return home to her [employer], [employer]. Pt left in recliner, all needs in place. Pt will benefit from acute skilled PT to increase their independence and safety with mobility to allow discharge.       If plan is discharge home, recommend the following: A little help with walking and/or transfers;Assistance with cooking/housework;Assist for transportation   Can travel by private vehicle        Equipment Recommendations None recommended by PT  Recommendations for Other Services       Functional Status Assessment Patient has had a recent decline in their functional status and demonstrates the ability to make significant improvements in function in a reasonable and predictable amount of time.     Precautions / Restrictions Precautions Precautions: Fall Restrictions Weight Bearing Restrictions Per Provider Order: No      Mobility  Bed Mobility Overal bed mobility: Modified Independent              General bed mobility comments: HOB slightly elevated no cues    Transfers Overall transfer level: Needs assistance Equipment used: None Transfers: Sit to/from Stand Sit to Stand: Supervision           General transfer comment: min cues for safety with transfer tasks bed, recliner and commode    Ambulation/Gait Ambulation/Gait assistance: Supervision Gait Distance (Feet): 400 Feet Assistive device: None Gait Pattern/deviations: Step-through pattern Gait velocity: slightly decreased     General Gait Details: pt demonstrating good reciprocal pattern, LEs passing in stance phase, no overt LOB, no AD min cues for posture safety and direction  Stairs Stairs: Yes Stairs assistance: Supervision Stair Management: One rail Right Number of Stairs: 12 General stair comments: min cues, pt able to demonstrate reciprocal pattern ascending steps and step to descending  Wheelchair Mobility     Tilt Bed    Modified Rankin (Stroke Patients Only)       Balance Overall balance assessment: No apparent balance deficits (not formally assessed)                                           Pertinent Vitals/Pain Pain Assessment Pain Assessment: No/denies pain    Home Living Family/patient expects to be discharged to:: Private residence Living Arrangements: Alone Available Help at Discharge: Family Type of Home: House Home Access: Stairs to enter Entrance Stairs-Rails: Left Entrance Stairs-Number of Steps: 5 Alternate Level Stairs-Number of  Steps: flight Home Layout: Two level;Able to live on main level with bedroom/bathroom Home Equipment: None      Prior Function Prior Level of Function : Independent/Modified Independent;Driving             Mobility Comments: IND no AD for all ADLs, self care tasks and IADLs       Extremity/Trunk Assessment        Lower Extremity Assessment Lower Extremity Assessment: Overall WFL for tasks assessed     Cervical / Trunk Assessment Cervical / Trunk Assessment: Normal  Communication   Communication Communication: No apparent difficulties    Cognition Arousal: Alert Behavior During Therapy: WFL for tasks assessed/performed   PT - Cognitive impairments: No apparent impairments                         Following commands: Intact       Cueing       General Comments      Exercises     Assessment/Plan    PT Assessment Patient needs continued PT services  PT Problem List Decreased activity tolerance;Decreased mobility       PT Treatment Interventions Gait training;Stair training;Functional mobility training;Therapeutic activities;Therapeutic exercise;Balance training;Neuromuscular re-education;Patient/family education    PT Goals (Current goals can be found in the Care Plan section)  Acute Rehab PT Goals Patient Stated Goal: to be able to go home 10/29 to my cat Wanda Downs PT Goal Formulation: With patient Time For Goal Achievement: 08/20/24 Potential to Achieve Goals: Good    Frequency Min 3X/week     Co-evaluation               AM-PAC PT 6 Clicks Mobility  Outcome Measure Help needed turning from your back to your side while in a flat bed without using bedrails?: None Help needed moving from lying on your back to sitting on the side of a flat bed without using bedrails?: None Help needed moving to and from a bed to a chair (including a wheelchair)?: A Little Help needed standing up from a chair using your arms (e.g., wheelchair or bedside chair)?: A Little Help needed to walk in hospital room?: A Little Help needed climbing 3-5 steps with a railing? : A Little 6 Click Score: 20    End of Session Equipment Utilized During Treatment: Gait belt Activity Tolerance: Patient tolerated treatment well Patient left: in chair;with call bell/phone within reach Nurse Communication: Mobility status PT Visit Diagnosis: Difficulty in walking, not elsewhere  classified (R26.2)    Time: 8557-8493 PT Time Calculation (min) (ACUTE ONLY): 24 min   Charges:   PT Evaluation $PT Eval Low Complexity: 1 Low PT Treatments $Gait Training: 8-22 mins PT General Charges $$ ACUTE PT VISIT: 1 Visit         [place], PT Acute Rehab   [place] VEAR Drone 08/06/2024, 5:37 PM

## 2024-08-06 NOTE — Evaluation (Signed)
 Occupational Therapy Evaluation Patient Details Name: Wanda Downs MRN: [id number] DOB: [birth year] Today's Date: 08/06/2024   History of Present Illness   83 yo female presents to therapy following hospital admission on 08/04/2024 due to a sudden violent onset of vomiting and abdominal pain. Pt was noted to have elevated LFTs, dehydrated and some hyponatremia possibly due to poor medication management. Pt PMH includes but is not limited to: celiacs dz, recurrent elevation of LFTs, fibromyalgia, hypothyroidism, OP, and depression.     Clinical Impressions PTA, patient was living at home alone and completing self-care tasks independently.  Patient endorsed caring for 1 cat and participating in the community, including driving, shopping, and going to the gym.  Patient was admitted with concern for polypharmacy.  Patient was administered Medi-Cog assessment, scoring 2 out of possible 10, indicating a significant deficit in medication management skills.  Acute OT will follow patient in the hospital to develop and implement compensatory strategies for managing medications and other IADL skills to support decreased risk of medication errors.  Social services has been advised and exploring options for follow up services.  Given patient's performance of BADL tasks at baseline, follow up OT services are not anticipated.  Acute OT will continue to follow patient while in the hospital to address performance deficits described above.  Thank you for allowing us  to participate in the care of this patient.     If plan is discharge home, recommend the following:   Direct supervision/assist for medications management;Direct supervision/assist for financial management (If determined that patient manages own finances)     Functional Status Assessment   Patient has not had a recent decline in their functional status     Equipment Recommendations   None recommended by OT     Recommendations for  Other Services         Precautions/Restrictions   Precautions Precautions: Fall Recall of Precautions/Restrictions: Intact Restrictions Weight Bearing Restrictions Per Provider Order: No     Mobility Bed Mobility Overal bed mobility: Independent    Transfers Overall transfer level: Needs assistance Equipment used: None Transfers: Sit to/from Stand Sit to Stand: Supervision      Balance Overall balance assessment: No apparent balance deficits (not formally assessed)     ADL either performed or assessed with clinical judgement   ADL Overall ADL's : Modified independent;At baseline   General ADL Comments: Patient presents with no discernible physical deficits that impact self-care performance.     Vision Baseline Vision/History: 0 No visual deficits Ability to See in Adequate Light: 0 Adequate Patient Visual Report: No change from baseline Vision Assessment?: No apparent visual deficits            Pertinent Vitals/Pain Pain Assessment Pain Assessment: No/denies pain     Extremity/Trunk Assessment Upper Extremity Assessment Upper Extremity Assessment: Overall WFL for tasks assessed;Right hand dominant   Lower Extremity Assessment Lower Extremity Assessment: Defer to PT evaluation   Cervical / Trunk Assessment Cervical / Trunk Assessment: Normal   Communication Communication Communication: No apparent difficulties   Cognition Arousal: Alert Behavior During Therapy: Anxious Cognition: Cognition impaired   Awareness: Intellectual awareness impaired, Online awareness impaired Memory impairment (select all impairments): Working civil service fast streamer, Short-term memory, Engineer, structural memory Attention impairment (select first level of impairment): Alternating attention Executive functioning impairment (select all impairments): Organization, Reasoning, Problem solving OT - Cognition Comments: Patient endorsed managing own meds  Patient was adminstered Medi-Cog  with a score of 2/10, indicating an increased potential for errors in medication  mangement.   Following commands: Intact       General Comments   Patient made a telling comment near end of session when referencing her brother: Our father left him in charge of me.           Home Living Family/patient expects to be discharged to:: Private residence Living Arrangements: Alone Available Help at Discharge: Family Type of Home: House Home Access: Stairs to enter [employer] of Steps: 2 at kitchen, 7 and front door Entrance Stairs-Rails: Right;Left Home Layout: Two level;Able to live on main level with bedroom/bathroom (Has office & exercise equipment on upper floor) Alternate Level Stairs-Number of Steps: flight Alternate Level Stairs-Rails: Right;Can reach both;Left Bathroom Shower/Tub: Tub/shower unit Bathroom Toilet: Handicapped height Bathroom Accessibility: Yes Home Equipment: Grab bars - toilet;Grab bars - tub/shower;Hand held shower head      Prior Functioning/Environment Prior Level of Function : Independent/Modified Independent;Driving   Mobility Comments: 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 golfer (had to quit due to OA in hands), remains active by going to gym ADLs Comments: Independent at baseline with no AD    OT Problem List: Decreased cognition;Decreased safety awareness   OT Treatment/Interventions: Cognitive remediation/compensation;Patient/family education      OT Goals(Current goals can be found in the care plan section)   Acute Rehab OT Goals Patient Stated Goal: To go home OT Goal Formulation: With patient Time For Goal Achievement: 08/20/24 Potential to Achieve Goals: Good ADL Goals Additional ADL Goal #1: Patient will verbalize 3 strategies for increasing safety in the home including medication management and other IADL activites as appropriate. Additional ADL Goal #2: Patient will demonstrate safe medication management skills using the Pill Box  Test or similar tool.   OT Frequency:  Min 2X/week       AM-PAC OT 6 Clicks Daily Activity     Outcome Measure Help from another person eating meals?: None Help from another person taking care of personal grooming?: None Help from another person toileting, which includes using toliet, bedpan, or urinal?: None Help from another person bathing (including washing, rinsing, drying)?: None Help from another person to put on and taking off regular upper body clothing?: None Help from another person to put on and taking off regular lower body clothing?: None 6 Click Score: 24   End of Session Nurse Communication: Other (comment) (Contacted SW with results of Medi-Cog)  Activity Tolerance: Patient tolerated treatment well Patient left: in bed;with call bell/phone within reach  OT Visit Diagnosis: Other symptoms and signs involving cognitive function                Time: 8358-9488 OT Time Calculation (min): 750 min Charges:  OT General Charges $OT Visit: 1 Visit OT Evaluation $OT Eval Moderate Complexity: 1 Mod OT Treatments $Therapeutic Activity: 8-22 mins  [place] B. Ceazia Harb, MS, OTR/L 08/06/2024, 6:04 PM

## 2024-08-06 NOTE — TOC Initial Note (Signed)
 Transition of Care [facility]) - Initial/Assessment Note    Patient Details  Name: Wanda Downs MRN: [id number] Date of Birth: 01/10/1941  Transition of [facility] CM/SW Contact:    Heather DELENA Saltness, LCSW Phone Number: 08/06/2024, 1:11 PM  Clinical Narrative:                 Pt admitted to the hospital from home due to sudden onset of nausea, violent vomiting, and mild abdominal pain. Pt from home in [place]. PT evaluation pending. TOC will continue to follow.  Expected Discharge Plan: Home/Self Care Barriers to Discharge: Continued Medical Work up   Patient Goals and CMS Choice Patient states their goals for this hospitalization and ongoing recovery are:: To return home        Expected Discharge Plan and Services In-house Referral: Clinical Social Work Discharge Planning Services: NA Post Acute Care Choice: NA Living arrangements for the past 2 months: Single Family Home                 DME Arranged: N/A DME Agency: NA       HH Arranged: NA HH Agency: NA        Prior Living Arrangements/Services Living arrangements for the past 2 months: Single Family Home Lives with:: Self Patient language and need for interpreter reviewed:: Yes Do you feel safe going back to the place where you live?: Yes      Need for Family Participation in Patient Care: Yes (Comment) Care giver support system in place?: Yes (comment)   Criminal Activity/Legal Involvement Pertinent to Current Situation/Hospitalization: No - Comment as needed  Activities of Daily Living   ADL Screening (condition at time of admission) Independently performs ADLs?: No Does the patient have a NEW difficulty with bathing/dressing/toileting/self-feeding that is expected to last >3 days?: No Does the patient have a NEW difficulty with getting in/out of bed, walking, or climbing stairs that is expected to last >3 days?: No Does the patient have a NEW difficulty with communication that is expected to last >3  days?: No Is the patient deaf or have difficulty hearing?: Yes Does the patient have difficulty seeing, even when wearing glasses/contacts?: Yes Does the patient have difficulty concentrating, remembering, or making decisions?: No  Permission Sought/Granted Permission sought to share information with : Family Supports Permission granted to share information with : Yes, Verbal Permission Granted  Share Information with NAME: Reeves Delores Raddle.     Permission granted to share info w Relationship: Brother  Permission granted to share info w Contact Information: [phone]  Emotional Assessment Appearance:: Appears stated age Attitude/Demeanor/Rapport: Unable to Assess Affect (typically observed): Unable to Assess Orientation: : Oriented to Self, Oriented to Place, Oriented to  Time, Oriented to Situation Alcohol  / Substance Use: Not Applicable Psych Involvement: No (comment)  Admission diagnosis:  Transaminitis [R74.01] Elevated liver enzymes [R74.8] Abdominal pain, unspecified abdominal location [R10.9] Patient Active Problem List   Diagnosis Date Noted   Transaminitis 08/04/2024   Impingement syndrome of left shoulder 12/15/2022   Hematochezia 10/24/2022   Acute colitis 10/24/2022   Hyponatremia 10/24/2022   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  [address] [place] [address] Phone: [phone] Fax: [phone]  [id number] - [place], [place] - [address] AT [address] [place] [address] Phone: [phone] Fax: [phone]     Social  Drivers of Health (SDOH) Social History: SDOH Screenings   Food Insecurity: No Food Insecurity (08/04/2024)  Housing: Low Risk  (08/04/2024)  Transportation Needs: No Transportation Needs (08/04/2024)  Utilities: Not At Risk (08/04/2024)  Social Connections: Moderately Integrated (08/04/2024)  Tobacco Use: Medium Risk (08/05/2024)   SDOH Interventions: None indicated     Readmission Risk Interventions    08/06/2024    1:09 PM  Readmission Risk Prevention Plan  Post Dischage Appt Complete  Medication Screening Complete  Transportation Screening Complete    Signed: Heather Saltness, MSW, LCSW Clinical Social Worker Inpatient Care Management 08/06/2024 1:13 PM

## 2024-08-06 NOTE — Progress Notes (Signed)
 PROGRESS NOTE    Wanda Downs  FMW:[id number] DOB: Jul 21, 1941 DOA: 08/04/2024 PCP: Wanda Hamilton, MD    Brief Narrative:  83 year old with history of celiac disease, depression, fibromyalgia, hypothyroidism presented to the emergency room with sudden onset of nausea and violent vomiting with mild abdominal pain.  No other symptoms.  Afebrile.  No sick contacts.  Patient was recently taking Tylenol  every 4 hours, she is not sure how she was taking however stated that she was taking only for 1 day.  In the emergency room transaminases elevated to 2000, sodium 126, normal bilirubin.  Hemodynamically stable.  Tylenol  level was less than 7.  Admitted for symptomatic treatment.  After admission, even though there was delay in administration of Mucomyst, we attempted to deliver Mucomyst infusion.  Patient had reaction including burning, dry heaving and she was not able to tolerate that.  Apparently, her LFTs are already improving.  Subjective:  Patient seen and examined today.  Patient feels back to herself and wants to go home.  She is just worried about remaining in the hospital.  Wants to go home.  Brother at the bedside.  Patient denies any complaints then again she had used 1 dose of Zofran  in the morning that gave her burning sensation.  Apparently every medicine gives her burning sensation.  Patient's brother brought some medications, supplements that she takes at home.  I could count at least 18 bottles of supplements that she takes from different providers, holistic healers and hormonal doctors.  We discussed in detail about stopping all those medications as there could be some duplication she is doing.  Hopefully she will agree.     Assessment & Plan:   Nausea vomiting, acute transaminitis: Suspected Tylenol  induced injury, Tylenol  level nontoxic.  Possibly combination of her supplements on top of Tylenol  causing the symptoms.  Could be hypovolemic and shock liver as she presented  with sodium of 126.  Clinically stabilizing.  Continue IV fluids.  Continue monitoring liver function tests. Attempted to give N-acetylcysteine, she could not tolerate it. LFTs are trending down.  Symptomatically improving.  GI following.  Hepatitis panel negative. Recheck levels tomorrow and likely home with outpatient follow-up.  Patient may likely not keep up the follow-up.  Hypochloremic hyponatremia with clinical dehydration: Treated with isotonic fluids.  Levels of renal improving.  Will monitor.    Hypothyroidism: On thyroid  replacement.  TSH is adequate.  Failure to thrive and weight loss: B12, folic acid , TSH adequate.  CT scan of abdomen did not show any evidence of liver disease or complication. Suspected colitis, however no clinical evidence.  Discontinued antibiotics.   DVT prophylaxis: enoxaparin (LOVENOX) injection 40 mg Start: 08/04/24 2200   Code Status: Full code  Family Communication: Brother called , unable to reach  Disposition Plan: Status is: Inpatient Remains inpatient appropriate because: Significantly elevated LFTs, monitoring     Consultants:  Gastroenterology   Procedures:  None  Antimicrobials:  Rocephin and Flagyl  10/26-10/27.     Objective: Vitals:   08/05/24 2203 08/06/24 0002 08/06/24 0456 08/06/24 1219  BP: (!) 141/78 137/78 (!) 106/58 126/70  Pulse: 80 83 72 75  Resp: 18  18 16   Temp: 98.8 F (37.1 C)  98.3 F (36.8 C) 98.6 F (37 C)  TempSrc:      SpO2: 100% 98% 97% 100%  Weight:      Height:        Intake/Output Summary (Last 24 hours) at 08/06/2024 1257 Last data filed at 08/06/2024  1036 Gross per 24 hour  Intake 2723.5 ml  Output --  Net 2723.5 ml   Filed Weights   08/04/24 1359  Weight: 46.7 kg    Examination:  General exam: Appears calm and comfortable.  Pleasant interaction. Respiratory system: Clear to auscultation. Respiratory effort normal. Cardiovascular system: S1 & S2 heard, RRR. No JVD, murmurs, rubs,  gallops or clicks. No pedal edema. Gastrointestinal system: Abdomen is nondistended, soft and nontender. No organomegaly or masses felt. Normal bowel sounds heard. Central nervous system: Alert and oriented. No focal neurological deficits.      Data Reviewed: I have personally reviewed following labs and imaging studies  CBC: Recent Labs  Lab 08/04/24 1430 08/05/24 0436 08/06/24 0108  WBC 9.6 7.4 7.5  NEUTROABS  --   --  5.5  HGB 14.6 13.4 12.9  HCT 41.9 40.1 37.5  MCV 85.0 88.9 87.2  PLT 248 224 208   Basic Metabolic Panel: Recent Labs  Lab 08/04/24 1430 08/05/24 0436 08/06/24 0108  NA 124* 130* 132*  K 3.8 3.5 3.4*  CL 84* 97* 99  CO2 28 23 23   GLUCOSE 119* 91 101*  BUN 8 9 8   CREATININE 0.66 0.57 0.53  CALCIUM 9.6 8.5* 8.1*   GFR: Estimated Creatinine Clearance: 40 mL/min (by C-G formula based on SCr of 0.53 mg/dL). Liver Function Tests: Recent Labs  Lab 08/04/24 1430 08/05/24 0436 08/06/24 0108 08/06/24 0110  AST 1,916* 1,664* 985* 986*  ALT 2,242* 2,858* 2,497* 2,497*  ALKPHOS 85 69 65 64  BILITOT 0.6 0.3 0.3 0.4  PROT 7.4 6.1* 5.9* 5.7*  ALBUMIN 4.5 3.5 3.4* 3.3*   Recent Labs  Lab 08/04/24 1430  LIPASE 10*   No results for input(s): AMMONIA in the last 168 hours. Coagulation Profile: Recent Labs  Lab 08/04/24 1430 08/06/24 0110  INR 1.1 1.2   Cardiac Enzymes: No results for input(s): CKTOTAL, CKMB, CKMBINDEX, TROPONINI in the last 168 hours. BNP (last 3 results) No results for input(s): PROBNP in the last 8760 hours. HbA1C: No results for input(s): HGBA1C in the last 72 hours. CBG: No results for input(s): GLUCAP in the last 168 hours. Lipid Profile: No results for input(s): CHOL, HDL, LDLCALC, TRIG, CHOLHDL, LDLDIRECT in the last 72 hours. Thyroid  Function Tests: Recent Labs    08/05/24 1116  TSH 2.490   Anemia Panel: Recent Labs    08/05/24 1116  VITAMINB12 >4,000*  FOLATE 17.8   Sepsis  Labs: No results for input(s): PROCALCITON, LATICACIDVEN in the last 168 hours.  No results found for this or any previous visit (from the past 240 hours).       Radiology Studies: CT ABDOMEN PELVIS W CONTRAST Result Date: 08/04/2024 CLINICAL DATA:  Acute abdominal pain. EXAM: CT ABDOMEN AND PELVIS WITH CONTRAST TECHNIQUE: Multidetector CT imaging of the abdomen and pelvis was performed using the standard protocol following bolus administration of intravenous contrast. RADIATION DOSE REDUCTION: This exam was performed according to the departmental dose-optimization program which includes automated exposure control, adjustment of the mA and/or kV according to patient size and/or use of iterative reconstruction technique. CONTRAST:  75mL OMNIPAQUE  IOHEXOL  300 MG/ML  SOLN COMPARISON:  Abdominal CTA 10/24/2022 FINDINGS: Lower chest: Hypoventilatory changes dependently. No pleural effusion. Bilateral breast implants with suspected intracapsular rupture on the right. Hepatobiliary: No focal liver abnormality is seen. No gallstones, gallbladder wall thickening, or biliary dilatation. Pancreas: Unremarkable. No pancreatic ductal dilatation or surrounding inflammatory changes. Spleen: Normal in size without focal abnormality.  Small splenules.  Adrenals/Urinary Tract: Stable left adrenal thickening. No suspicious adrenal nodule. No hydronephrosis or perinephric edema. Homogeneous renal enhancement with symmetric excretion on delayed phase imaging. Urinary bladder is physiologically distended without wall thickening. Stomach/Bowel: Fluid distention of the stomach. Scattered fluid within small bowel. No obstruction or small bowel wall thickening. The appendix is normal. Wall thickening versus nondistention of the descending and sigmoid colon. Scattered colonic diverticula without focal diverticulitis. Vascular/Lymphatic: Aortic atherosclerosis. No aortic aneurysm. Patent portal, splenic, and mesenteric veins. No  suspicious lymphadenopathy. Reproductive: Status post hysterectomy. No adnexal masses. Other: Small amount of fluid in right femoral hernia is unchanged from prior exam. The free fluid in the pelvis on prior has resolved. No free air. Musculoskeletal: Fusion of L1-L2 may be postsurgical or degenerative. Multilevel degenerative change in the spine. There are no acute or suspicious osseous abnormalities. IMPRESSION: 1. Wall thickening versus nondistention of the descending and sigmoid colon, can be seen with colitis. 2. Colonic diverticulosis without focal diverticulitis. 3. Small amount of fluid in right femoral hernia is unchanged from prior exam. Aortic Atherosclerosis (ICD10-I70.0). Electronically Signed   By: Andrea Gasman M.D.   On: 08/04/2024 16:20        Scheduled Meds:  enoxaparin (LOVENOX) injection  40 mg Subcutaneous Q24H   Influenza vac split trivalent PF  0.5 mL Intramuscular Tomorrow-1000   potassium chloride   20 mEq Oral BID   thyroid   60 mg Oral Daily   Continuous Infusions:  sodium chloride  Stopped (08/06/24 0100)     LOS: 2 days    Time spent: 51 minutes     Renato Applebaum, MD [facility]

## 2024-08-07 DIAGNOSIS — R7401 Elevation of levels of liver transaminase levels: Secondary | ICD-10-CM | POA: Diagnosis not present

## 2024-08-07 LAB — COMPREHENSIVE METABOLIC PANEL WITH GFR
ALT: 1565 U/L — ABNORMAL HIGH (ref 0–44)
AST: 401 U/L — ABNORMAL HIGH (ref 15–41)
Albumin: 3.5 g/dL (ref 3.5–5.0)
Alkaline Phosphatase: 101 U/L (ref 38–126)
Anion gap: 8 (ref 5–15)
BUN: 6 mg/dL — ABNORMAL LOW (ref 8–23)
CO2: 24 mmol/L (ref 22–32)
Calcium: 8.5 mg/dL — ABNORMAL LOW (ref 8.9–10.3)
Chloride: 98 mmol/L (ref 98–111)
Creatinine, Ser: 0.51 mg/dL (ref 0.44–1.00)
GFR, Estimated: >60 mL/min (ref >60)
Glucose, Bld: 91 mg/dL (ref 70–99)
Potassium: 3.8 mmol/L (ref 3.5–5.1)
Sodium: 131 mmol/L — ABNORMAL LOW (ref 135–145)
Total Bilirubin: 0.5 mg/dL (ref 0.0–1.2)
Total Protein: 6 g/dL — ABNORMAL LOW (ref 6.5–8.1)

## 2024-08-07 MED ORDER — THYROID 60 MG PO TABS: 60.0000 mg | ORAL_TABLET | Freq: Every day | ORAL | Status: AC

## 2024-08-07 MED ORDER — GUAIFENESIN-DM 100-10 MG/5ML PO SYRP
5.0000 mL | ORAL_SOLUTION | ORAL | Status: DC | PRN
  Administered 2024-08-07: 5 mL via ORAL
  Filled 2024-08-07: qty 5

## 2024-08-07 MED ORDER — POTASSIUM CHLORIDE CRYS ER 20 MEQ PO TBCR: 20.0000 meq | EXTENDED_RELEASE_TABLET | Freq: Every day | ORAL | 0 refills | Status: AC

## 2024-08-07 NOTE — Plan of Care (Signed)

## 2024-08-07 NOTE — Discharge Summary (Signed)
 Physician Discharge Summary  Wanda Downs FMW:[id number] DOB: October 11, 1940 DOA: 08/04/2024  PCP: Larnell Hamilton, MD  Admit date: 08/04/2024 Discharge date: 08/07/2024  Admitted From: Home Disposition: Home  Recommendations for Outpatient Follow-up:  Follow up with PCP in 1-2 weeks Please obtain liver function test/CBC in one week GI to schedule follow-up  Home Health: N/A Equipment/Devices: N/A  Discharge Condition: Stable CODE STATUS: Full code Diet recommendation: Regular diet  Discharge summary: 83 year old with history of celiac disease, depression, fibromyalgia, hypothyroidism presented to the emergency room with sudden onset of nausea and violent vomiting with mild abdominal pain.  No other symptoms.  Afebrile.  No sick contacts.  Patient was recently taking Tylenol  every 4 hours, she is not sure how she was taking however stated that she was taking only for 1 day.  In the emergency room transaminases elevated to 2000, sodium 126, normal bilirubin.  Hemodynamically stable.  Tylenol  level was less than 7.  Admitted for symptomatic treatment.   After admission, even though there was delay in administration of Mucomyst, we attempted to give Mucomyst infusion.  Patient had reaction including burning, dry heaving and she was not able to tolerate that.  Her LFTs spontaneously downtrending with supportive care.  Today she is asymptomatic.  Primary cause unknown but suspect polypharmacy.  Nausea vomiting, acute transaminitis: Suspected Tylenol  induced injury, Tylenol  level nontoxic.  Possibly combination of her supplements on top of Tylenol  causing the symptoms.  Could be hypovolemic and shock liver as she presented with sodium of 126.  Clinically stabilized.  Asymptomatic today.  LFTs are trending down.  Symptomatically improving.  GI following.  Hepatitis panel negative. With downtrending LFTs and not having active symptoms, patient is willing to go home and recheck levels in 1  week. Patient is on at least 15-20 supplements, hormonal treatments, multiple unknown supplemental medications from different providers. Combination of medication may be causing liver damage.  This was all stopped. On discharge, counseled extensively on holding all nonformulary medications and following up with her provider.  I recommended patient to take medications from only one provider as much possible to avoid duplication.  Patient agreed.   Hypochloremic hyponatremia with clinical dehydration: Treated with isotonic fluids.  Normalized.  Now eating and drinking adequate.   Hypothyroidism: On thyroid  replacement.  TSH is adequate.   Failure to thrive and weight loss: B12, folic acid , TSH adequate.  CT scan of abdomen did not show any evidence of liver disease or complication. Family requested a neurocognitive evaluation, patient currently without any deficits.  Will send a follow-up to neurology for outpatient evaluation.  Patient is stable to discharge home with close outpatient follow-up.   Discharge Diagnoses:  Principal Problem:   Transaminitis Active Problems:   Acute colitis   Hyponatremia   Hypothyroidism   Celiac disease   LIVER FUNCTION TESTS, ABNORMAL    Discharge Instructions  Discharge Instructions     Ambulatory referral to Neurology   Complete by: As directed    An appointment is requested in approximately: 4 weeks   Diet - low sodium heart healthy   Complete by: As directed    Discharge instructions   Complete by: As directed    Do not take any medications with tylenol  or tylenol  products on it  Recheck CMP in one week   Increase activity slowly   Complete by: As directed       Allergies as of 08/07/2024       Reactions   Barley Grass Other (See  Comments)   **Celiac disease is active stage**   Gluten Meal    Other Reaction(s): Other (See Comments) **Celiac disease is active stage**   Oat Nausea And Vomiting   **celiac disease is active stage**    Wheat Extract Other (See Comments)   **Celiac disease is active stage**   Amlodipine Besylate Other (See Comments)   Feels awful   Olmesartan Medoxomil Other (See Comments)   GI celiac issues   Penicillins Swelling, Rash, Other (See Comments)   Fever, also   Sulfa Antibiotics Nausea Only, Swelling, Rash        Medication List     STOP taking these medications    acetaminophen  500 MG tablet Commonly known as: TYLENOL    Acetyl-L-Carnitine HCl Powd   Alpha-Lipoic Acid 600 MG Caps   BETAINE HCL PO   COD LIVER OIL PO   estradiol  0.1 MG/24HR patch Commonly known as: VIVELLE -DOT   imiquimod 5 % cream Commonly known as: ALDARA   L-Lysine 500 MG Caps   lisinopril 10 MG tablet Commonly known as: ZESTRIL   MSM PO   NONFORMULARY OR COMPOUNDED ITEM   NONFORMULARY OR COMPOUNDED ITEM   OVER THE COUNTER MEDICATION   OVER THE COUNTER MEDICATION   OVER THE COUNTER MEDICATION   OVER THE COUNTER MEDICATION   OVER THE COUNTER MEDICATION   OVER THE COUNTER MEDICATION   OVER THE COUNTER MEDICATION   OVER THE COUNTER MEDICATION   OVER THE COUNTER MEDICATION   VITAMIN A  PO   Voltaren  1 % Gel Generic drug: diclofenac  Sodium       TAKE these medications    azelastine 0.05 % ophthalmic solution Commonly known as: OPTIVAR Place 1 drop into both eyes Once daily as needed (for seasonal allergies).   b complex vitamins capsule Take 1 capsule by mouth daily.   B-12 PO Take 1 tablet by mouth daily.   famotidine 40 MG tablet Commonly known as: PEPCID Take 40 mg by mouth daily.   Feosol 200 (65 Fe) MG Tabs Generic drug: Ferrous Sulfate Dried Take 65 mg by mouth daily.   fluticasone  50 MCG/ACT nasal spray Commonly known as: FLONASE  Place 2 sprays into the nose daily. What changed:  when to take this reasons to take this   OMEGA 3 PO Take 2,000 mg by mouth daily.   potassium chloride  SA 20 MEQ tablet Commonly known as: KLOR-CON  M Take 1 tablet  (20 mEq total) by mouth daily for 5 days.   thyroid  60 MG tablet Commonly known as: ARMOUR Take 1 tablet (60 mg total) by mouth daily. Take 1 tablet po daily of compounded 75 mg   Tums 500 MG chewable tablet Generic drug: calcium carbonate Chew 1 tablet by mouth 3 (three) times daily as needed for indigestion or heartburn.   Vitamin D3 1000 units Caps Take 5,000 Units by mouth daily as needed.        Allergies  Allergen Reactions   Barley Grass Other (See Comments)    **Celiac disease is active stage**   Gluten Meal     Other Reaction(s): Other (See Comments)  **Celiac disease is active stage**   Oat Nausea And Vomiting    **celiac disease is active stage**   Wheat Extract Other (See Comments)    **Celiac disease is active stage**   Amlodipine Besylate Other (See Comments)    Feels awful   Olmesartan Medoxomil Other (See Comments)    GI celiac issues   Penicillins Swelling, Rash and Other (  See Comments)    Fever, also   Sulfa Antibiotics Nausea Only, Swelling and Rash    Consultations: Gastroenterology   Procedures/Studies: CT ABDOMEN PELVIS W CONTRAST Result Date: 08/04/2024 CLINICAL DATA:  Acute abdominal pain. EXAM: CT ABDOMEN AND PELVIS WITH CONTRAST TECHNIQUE: Multidetector CT imaging of the abdomen and pelvis was performed using the standard protocol following bolus administration of intravenous contrast. RADIATION DOSE REDUCTION: This exam was performed according to the departmental dose-optimization program which includes automated exposure control, adjustment of the mA and/or kV according to patient size and/or use of iterative reconstruction technique. CONTRAST:  75mL OMNIPAQUE  IOHEXOL  300 MG/ML  SOLN COMPARISON:  Abdominal CTA 10/24/2022 FINDINGS: Lower chest: Hypoventilatory changes dependently. No pleural effusion. Bilateral breast implants with suspected intracapsular rupture on the right. Hepatobiliary: No focal liver abnormality is seen. No gallstones,  gallbladder wall thickening, or biliary dilatation. Pancreas: Unremarkable. No pancreatic ductal dilatation or surrounding inflammatory changes. Spleen: Normal in size without focal abnormality.  Small splenules. Adrenals/Urinary Tract: Stable left adrenal thickening. No suspicious adrenal nodule. No hydronephrosis or perinephric edema. Homogeneous renal enhancement with symmetric excretion on delayed phase imaging. Urinary bladder is physiologically distended without wall thickening. Stomach/Bowel: Fluid distention of the stomach. Scattered fluid within small bowel. No obstruction or small bowel wall thickening. The appendix is normal. Wall thickening versus nondistention of the descending and sigmoid colon. Scattered colonic diverticula without focal diverticulitis. Vascular/Lymphatic: Aortic atherosclerosis. No aortic aneurysm. Patent portal, splenic, and mesenteric veins. No suspicious lymphadenopathy. Reproductive: Status post hysterectomy. No adnexal masses. Other: Small amount of fluid in right femoral hernia is unchanged from prior exam. The free fluid in the pelvis on prior has resolved. No free air. Musculoskeletal: Fusion of L1-L2 may be postsurgical or degenerative. Multilevel degenerative change in the spine. There are no acute or suspicious osseous abnormalities. IMPRESSION: 1. Wall thickening versus nondistention of the descending and sigmoid colon, can be seen with colitis. 2. Colonic diverticulosis without focal diverticulitis. 3. Small amount of fluid in right femoral hernia is unchanged from prior exam. Aortic Atherosclerosis (ICD10-I70.0). Electronically Signed   By: Andrea Gasman M.D.   On: 08/04/2024 16:20   (Echo, Carotid, EGD, Colonoscopy, ERCP)    Subjective: Patient seen and examined.  Denies any complaints.  Eager to go home.  She had tiled her brother to come and take her.  Patient herself is without any issues today.  We discussed with patient and her brother about improving  liver function test but needs to be checked in 1 week to ensure normalization.  Patient and family agreed.  Patient also agreed not to go back on unknown medications.   Discharge Exam: Vitals:   08/06/24 2018 08/07/24 0414  BP: 123/75 137/80  Pulse: 89 86  Resp: 18 18  Temp: 98.3 F (36.8 C) (!) 97.2 F (36.2 C)  SpO2: 100% 97%   Vitals:   08/06/24 0456 08/06/24 1219 08/06/24 2018 08/07/24 0414  BP: (!) 106/58 126/70 123/75 137/80  Pulse: 72 75 89 86  Resp: 18 16 18 18   Temp: 98.3 F (36.8 C) 98.6 F (37 C) 98.3 F (36.8 C) (!) 97.2 F (36.2 C)  TempSrc:    Axillary  SpO2: 97% 100% 100% 97%  Weight:      Height:        General: Pt is alert, awake, not in acute distress.  Pleasant interactive.  Oriented x 4. Cardiovascular: RRR, S1/S2 +, no rubs, no gallops Respiratory: CTA bilaterally, no wheezing, no rhonchi Abdominal: Soft, NT,  ND, bowel sounds + Extremities: no edema, no cyanosis    The results of significant diagnostics from this hospitalization (including imaging, microbiology, ancillary and laboratory) are listed below for reference.     Microbiology: No results found for this or any previous visit (from the past 240 hours).   Labs: BNP (last 3 results) No results for input(s): BNP in the last 8760 hours. Basic Metabolic Panel: Recent Labs  Lab 08/04/24 1430 08/05/24 0436 08/06/24 0108 08/07/24 0555  NA 124* 130* 132* 131*  K 3.8 3.5 3.4* 3.8  CL 84* 97* 99 98  CO2 28 23 23 24   GLUCOSE 119* 91 101* 91  BUN 8 9 8  6*  CREATININE 0.66 0.57 0.53 0.51  CALCIUM 9.6 8.5* 8.1* 8.5*   Liver Function Tests: Recent Labs  Lab 08/04/24 1430 08/05/24 0436 08/06/24 0108 08/06/24 0110 08/07/24 0555  AST 1,916* 1,664* 985* 986* 401*  ALT 2,242* 2,858* 2,497* 2,497* 1,565*  ALKPHOS 85 69 65 64 101  BILITOT 0.6 0.3 0.3 0.4 0.5  PROT 7.4 6.1* 5.9* 5.7* 6.0*  ALBUMIN 4.5 3.5 3.4* 3.3* 3.5   Recent Labs  Lab 08/04/24 1430  LIPASE 10*   No results  for input(s): AMMONIA in the last 168 hours. CBC: Recent Labs  Lab 08/04/24 1430 08/05/24 0436 08/06/24 0108  WBC 9.6 7.4 7.5  NEUTROABS  --   --  5.5  HGB 14.6 13.4 12.9  HCT 41.9 40.1 37.5  MCV 85.0 88.9 87.2  PLT 248 224 208   Cardiac Enzymes: No results for input(s): CKTOTAL, CKMB, CKMBINDEX, TROPONINI in the last 168 hours. BNP: Invalid input(s): POCBNP CBG: No results for input(s): GLUCAP in the last 168 hours. D-Dimer No results for input(s): DDIMER in the last 72 hours. Hgb A1c No results for input(s): HGBA1C in the last 72 hours. Lipid Profile No results for input(s): CHOL, HDL, LDLCALC, TRIG, CHOLHDL, LDLDIRECT in the last 72 hours. Thyroid  function studies Recent Labs    08/05/24 1116  TSH 2.490   Anemia work up Recent Labs    08/05/24 1116  VITAMINB12 >4,000*  FOLATE 17.8   Urinalysis    Component Value Date/Time   COLORURINE YELLOW 10/24/2022 1452   APPEARANCEUR CLEAR 10/24/2022 1452   LABSPEC 1.018 10/24/2022 1452   PHURINE 5.0 10/24/2022 1452   GLUCOSEU NEGATIVE 10/24/2022 1452   HGBUR MODERATE (A) 10/24/2022 1452   HGBUR trace-intact 09/12/2007 1145   BILIRUBINUR NEGATIVE 10/24/2022 1452   KETONESUR NEGATIVE 10/24/2022 1452   PROTEINUR 30 (A) 10/24/2022 1452   UROBILINOGEN .sign0.2 09/12/2007 1145   NITRITE NEGATIVE 10/24/2022 1452   LEUKOCYTESUR NEGATIVE 10/24/2022 1452   Sepsis Labs Recent Labs  Lab 08/04/24 1430 08/05/24 0436 08/06/24 0108  WBC 9.6 7.4 7.5   Microbiology No results found for this or any previous visit (from the past 240 hours).   Time coordinating discharge: 45 minutes  SIGNED:   Renato Applebaum, MD  [facility] 08/07/2024, 9:22 AM

## 2024-08-07 NOTE — Progress Notes (Signed)
 Discharge instructions given to patient questions asked and answered.

## 2024-08-14 DIAGNOSIS — Z79899 Other long term (current) drug therapy: Secondary | ICD-10-CM | POA: Diagnosis not present

## 2024-08-14 DIAGNOSIS — E871 Hypo-osmolality and hyponatremia: Secondary | ICD-10-CM | POA: Diagnosis not present

## 2024-08-14 DIAGNOSIS — N951 Menopausal and female climacteric states: Secondary | ICD-10-CM | POA: Diagnosis not present

## 2024-08-14 DIAGNOSIS — E039 Hypothyroidism, unspecified: Secondary | ICD-10-CM | POA: Diagnosis not present

## 2024-08-14 DIAGNOSIS — K219 Gastro-esophageal reflux disease without esophagitis: Secondary | ICD-10-CM | POA: Diagnosis not present

## 2024-08-14 DIAGNOSIS — R7401 Elevation of levels of liver transaminase levels: Secondary | ICD-10-CM | POA: Diagnosis not present

## 2024-08-14 DIAGNOSIS — K900 Celiac disease: Secondary | ICD-10-CM | POA: Diagnosis not present

## 2024-08-19 DIAGNOSIS — E785 Hyperlipidemia, unspecified: Secondary | ICD-10-CM | POA: Diagnosis not present

## 2024-08-28 DIAGNOSIS — N951 Menopausal and female climacteric states: Secondary | ICD-10-CM | POA: Diagnosis not present

## 2024-09-18 DIAGNOSIS — N951 Menopausal and female climacteric states: Secondary | ICD-10-CM | POA: Diagnosis not present

## 2024-09-23 DIAGNOSIS — Z85828 Personal history of other malignant neoplasm of skin: Secondary | ICD-10-CM | POA: Diagnosis not present

## 2024-09-23 DIAGNOSIS — L84 Corns and callosities: Secondary | ICD-10-CM | POA: Diagnosis not present

## 2024-09-23 DIAGNOSIS — D692 Other nonthrombocytopenic purpura: Secondary | ICD-10-CM | POA: Diagnosis not present

## 2024-09-23 DIAGNOSIS — D485 Neoplasm of uncertain behavior of skin: Secondary | ICD-10-CM | POA: Diagnosis not present

## 2024-09-23 DIAGNOSIS — C44319 Basal cell carcinoma of skin of other parts of face: Secondary | ICD-10-CM | POA: Diagnosis not present

## 2024-09-23 DIAGNOSIS — L821 Other seborrheic keratosis: Secondary | ICD-10-CM | POA: Diagnosis not present
